# Patient Record
Sex: Female | Born: 1980
Health system: Southern US, Community
[De-identification: ages and names within clinical notes are randomized; demographics above are authoritative.]

## PROBLEM LIST (undated history)

## (undated) DIAGNOSIS — O24419 Gestational diabetes mellitus in pregnancy, unspecified control: Secondary | ICD-10-CM

## (undated) DIAGNOSIS — Z789 Other specified health status: Secondary | ICD-10-CM

## (undated) DIAGNOSIS — B181 Chronic viral hepatitis B without delta-agent: Principal | ICD-10-CM

## (undated) DIAGNOSIS — I10 Essential (primary) hypertension: Secondary | ICD-10-CM

## (undated) DIAGNOSIS — T4145XA Adverse effect of unspecified anesthetic, initial encounter: Secondary | ICD-10-CM

## (undated) DIAGNOSIS — T8859XA Other complications of anesthesia, initial encounter: Secondary | ICD-10-CM

## (undated) HISTORY — DX: Chronic viral hepatitis B without delta-agent: B18.1

## (undated) HISTORY — PX: WISDOM TOOTH EXTRACTION: SHX21

## (undated) HISTORY — DX: Essential (primary) hypertension: I10

## (undated) HISTORY — PX: NO PAST SURGERIES: SHX2092

---

## 2002-02-27 ENCOUNTER — Other Ambulatory Visit: Admission: RE | Admit: 2002-02-27 | Discharge: 2002-02-27 | Payer: Self-pay | Admitting: Obstetrics & Gynecology

## 2003-03-29 ENCOUNTER — Other Ambulatory Visit: Admission: RE | Admit: 2003-03-29 | Discharge: 2003-03-29 | Payer: Self-pay | Admitting: Obstetrics & Gynecology

## 2010-10-20 LAB — CBC: HCT: 37 % (ref 36–46)

## 2010-10-20 LAB — GC/CHLAMYDIA PROBE AMP, GENITAL
Chlamydia: NEGATIVE
Gonorrhea: NEGATIVE

## 2010-10-20 LAB — HIV ANTIBODY (ROUTINE TESTING W REFLEX): HIV: NONREACTIVE

## 2010-10-21 ENCOUNTER — Other Ambulatory Visit (HOSPITAL_COMMUNITY): Payer: Self-pay | Admitting: Obstetrics and Gynecology

## 2010-10-21 DIAGNOSIS — O3680X0 Pregnancy with inconclusive fetal viability, not applicable or unspecified: Secondary | ICD-10-CM

## 2010-10-23 ENCOUNTER — Ambulatory Visit (HOSPITAL_COMMUNITY)
Admission: RE | Admit: 2010-10-23 | Discharge: 2010-10-23 | Disposition: A | Discharge status: Home / Self Care | Payer: BC Managed Care – PPO | Source: Ambulatory Visit | Attending: Obstetrics and Gynecology | Admitting: Obstetrics and Gynecology

## 2010-10-23 ENCOUNTER — Encounter (HOSPITAL_COMMUNITY): Payer: Self-pay

## 2010-10-23 ENCOUNTER — Other Ambulatory Visit (HOSPITAL_COMMUNITY): Payer: Self-pay | Admitting: Obstetrics and Gynecology

## 2010-10-23 DIAGNOSIS — O3510X0 Maternal care for (suspected) chromosomal abnormality in fetus, unspecified, not applicable or unspecified: Secondary | ICD-10-CM | POA: Insufficient documentation

## 2010-10-23 DIAGNOSIS — O351XX0 Maternal care for (suspected) chromosomal abnormality in fetus, not applicable or unspecified: Secondary | ICD-10-CM | POA: Insufficient documentation

## 2010-10-23 DIAGNOSIS — Z3682 Encounter for antenatal screening for nuchal translucency: Secondary | ICD-10-CM

## 2010-10-23 DIAGNOSIS — O3680X0 Pregnancy with inconclusive fetal viability, not applicable or unspecified: Secondary | ICD-10-CM

## 2010-10-23 DIAGNOSIS — Z3689 Encounter for other specified antenatal screening: Secondary | ICD-10-CM | POA: Insufficient documentation

## 2010-10-23 NOTE — ED Notes (Signed)
Nuchal translucency ultrasound done and first blood draw for integrated screening.  Pt will return in 3 weeks for second blood draw.

## 2010-11-13 ENCOUNTER — Ambulatory Visit (HOSPITAL_COMMUNITY)
Admission: RE | Admit: 2010-11-13 | Discharge: 2010-11-13 | Disposition: A | Discharge status: Home / Self Care | Payer: BC Managed Care – PPO | Source: Ambulatory Visit | Attending: Obstetrics and Gynecology | Admitting: Obstetrics and Gynecology

## 2010-11-13 ENCOUNTER — Other Ambulatory Visit: Payer: Self-pay | Admitting: Maternal and Fetal Medicine

## 2010-11-13 DIAGNOSIS — O351XX0 Maternal care for (suspected) chromosomal abnormality in fetus, not applicable or unspecified: Secondary | ICD-10-CM | POA: Insufficient documentation

## 2010-11-13 DIAGNOSIS — O3510X0 Maternal care for (suspected) chromosomal abnormality in fetus, unspecified, not applicable or unspecified: Secondary | ICD-10-CM | POA: Insufficient documentation

## 2011-04-03 LAB — STREP B DNA PROBE: GBS: NEGATIVE

## 2011-04-07 NOTE — L&D Delivery Note (Signed)
Delivery Note At 7:49 AM a viable female. "Janet Jacobs", was delivered via Vaginal, Spontaneous Delivery (Presentation: Right Occiput Anterior).  APGAR: 8, 9; weight 7 lb 13 oz (3544 g).   Placenta status: Intact, Spontaneous.  Cord: 3 vessels with the following complications: None.  Cord pH: NA.  Labored in the tub, then moved to the bed when patient had difficulty coordinating pushing efforts.  Anesthesia: Local  Episiotomy: None Lacerations: 2nd degree;Perineal Suture Repair: 2.0 3.0 vicryl rapide Est. Blood Loss (mL):   Mom to postpartum.  Baby to skin to skin with mom.  Nigel Bridgeman 04/30/2011, 8:59 AM

## 2011-04-30 ENCOUNTER — Inpatient Hospital Stay (HOSPITAL_COMMUNITY)
Admission: AD | Admit: 2011-04-30 | Discharge: 2011-05-02 | DRG: 775 | Disposition: A | Discharge status: Home / Self Care | Payer: Medicaid Other | Attending: Obstetrics and Gynecology | Admitting: Obstetrics and Gynecology

## 2011-04-30 ENCOUNTER — Encounter (HOSPITAL_COMMUNITY): Payer: Self-pay | Admitting: *Deleted

## 2011-04-30 DIAGNOSIS — D649 Anemia, unspecified: Secondary | ICD-10-CM | POA: Diagnosis not present

## 2011-04-30 DIAGNOSIS — O9903 Anemia complicating the puerperium: Secondary | ICD-10-CM | POA: Diagnosis not present

## 2011-04-30 DIAGNOSIS — E669 Obesity, unspecified: Secondary | ICD-10-CM | POA: Diagnosis present

## 2011-04-30 HISTORY — DX: Other specified health status: Z78.9

## 2011-04-30 LAB — COMPREHENSIVE METABOLIC PANEL
Alkaline Phosphatase: 191 U/L — ABNORMAL HIGH (ref 39–117)
BUN: 7 mg/dL (ref 6–23)
Chloride: 100 mEq/L (ref 96–112)
GFR calc Af Amer: >90 mL/min (ref >90)
Glucose, Bld: 118 mg/dL — ABNORMAL HIGH (ref 70–99)
Potassium: 3.5 mEq/L (ref 3.5–5.1)
Total Bilirubin: 0.3 mg/dL (ref 0.3–1.2)
Total Protein: 5.7 g/dL — ABNORMAL LOW (ref 6.0–8.3)

## 2011-04-30 LAB — LACTATE DEHYDROGENASE: LDH: 188 U/L (ref 94–250)

## 2011-04-30 LAB — CBC
HCT: 34.9 % — ABNORMAL LOW (ref 36.0–46.0)
MCV: 85.3 fL (ref 78.0–100.0)
RDW: 14.1 % (ref 11.5–15.5)
WBC: 17.3 10*3/uL — ABNORMAL HIGH (ref 4.0–10.5)

## 2011-04-30 LAB — ANTIBODY SCREEN: Antibody Screen: NEGATIVE

## 2011-04-30 LAB — GC/CHLAMYDIA PROBE AMP, GENITAL: Gonorrhea: NEGATIVE

## 2011-04-30 LAB — HEPATITIS B SURFACE ANTIGEN: Hepatitis B Surface Ag: NEGATIVE

## 2011-04-30 LAB — RUBELLA ANTIBODY, IGM: Rubella: IMMUNE

## 2011-04-30 MED ORDER — MAGNESIUM HYDROXIDE 400 MG/5ML PO SUSP: 30.0000 mL | ORAL | Status: DC | PRN

## 2011-04-30 MED ORDER — OXYCODONE-ACETAMINOPHEN 5-325 MG PO TABS
2.0000 | ORAL_TABLET | ORAL | Status: DC | PRN
Start: 2011-04-30 — End: 2011-04-30

## 2011-04-30 MED ORDER — IBUPROFEN 600 MG PO TABS
600.0000 mg | ORAL_TABLET | Freq: Four times a day (QID) | ORAL | Status: DC
  Administered 2011-04-30 – 2011-05-02 (×8): 600 mg via ORAL
  Filled 2011-04-30 (×8): qty 1

## 2011-04-30 MED ORDER — BUTORPHANOL TARTRATE 2 MG/ML IJ SOLN: 1.0000 mg | INTRAMUSCULAR | Status: DC | PRN

## 2011-04-30 MED ORDER — LACTATED RINGERS IV SOLN: 500.0000 mL | INTRAVENOUS | Status: DC | PRN

## 2011-04-30 MED ORDER — OXYTOCIN 10 UNIT/ML IJ SOLN
INTRAMUSCULAR | Status: AC
  Filled 2011-04-30: qty 2

## 2011-04-30 MED ORDER — TETANUS-DIPHTH-ACELL PERTUSSIS 5-2.5-18.5 LF-MCG/0.5 IM SUSP: 0.5000 mL | Freq: Once | INTRAMUSCULAR | Status: DC

## 2011-04-30 MED ORDER — OXYTOCIN BOLUS FROM INFUSION
500.0000 mL | Freq: Once | INTRAVENOUS | Status: DC
  Filled 2011-04-30: qty 500

## 2011-04-30 MED ORDER — ZOLPIDEM TARTRATE 5 MG PO TABS: 5.0000 mg | ORAL_TABLET | Freq: Every evening | ORAL | Status: DC | PRN

## 2011-04-30 MED ORDER — HYDROXYZINE HCL 50 MG/ML IM SOLN: 50.0000 mg | Freq: Four times a day (QID) | INTRAMUSCULAR | Status: DC | PRN

## 2011-04-30 MED ORDER — DIBUCAINE 1 % RE OINT: 1.0000 "application " | TOPICAL_OINTMENT | RECTAL | Status: DC | PRN

## 2011-04-30 MED ORDER — MEASLES, MUMPS & RUBELLA VAC ~~LOC~~ INJ
0.5000 mL | INJECTION | Freq: Once | SUBCUTANEOUS | Status: DC
  Filled 2011-04-30: qty 0.5

## 2011-04-30 MED ORDER — LANOLIN HYDROUS EX OINT: TOPICAL_OINTMENT | CUTANEOUS | Status: DC | PRN

## 2011-04-30 MED ORDER — CITRIC ACID-SODIUM CITRATE 334-500 MG/5ML PO SOLN: 30.0000 mL | ORAL | Status: DC | PRN

## 2011-04-30 MED ORDER — FLEET ENEMA 7-19 GM/118ML RE ENEM: 1.0000 | ENEMA | RECTAL | Status: DC | PRN

## 2011-04-30 MED ORDER — CALCIUM CARBONATE ANTACID 500 MG PO CHEW
400.0000 mg | CHEWABLE_TABLET | Freq: Three times a day (TID) | ORAL | Status: DC | PRN
  Administered 2011-04-30: 400 mg via ORAL
  Filled 2011-04-30: qty 2

## 2011-04-30 MED ORDER — SIMETHICONE 80 MG PO CHEW
80.0000 mg | CHEWABLE_TABLET | ORAL | Status: DC | PRN
  Administered 2011-04-30: 80 mg via ORAL

## 2011-04-30 MED ORDER — OXYTOCIN 20 UNITS IN LACTATED RINGERS INFUSION - SIMPLE: 125.0000 mL/h | Freq: Once | INTRAVENOUS | Status: DC

## 2011-04-30 MED ORDER — SENNOSIDES-DOCUSATE SODIUM 8.6-50 MG PO TABS
2.0000 | ORAL_TABLET | Freq: Every day | ORAL | Status: DC
  Administered 2011-04-30 – 2011-05-01 (×2): 2 via ORAL

## 2011-04-30 MED ORDER — HYDROXYZINE HCL 50 MG PO TABS: 50.0000 mg | ORAL_TABLET | Freq: Four times a day (QID) | ORAL | Status: DC | PRN

## 2011-04-30 MED ORDER — WITCH HAZEL-GLYCERIN EX PADS
1.0000 "application " | MEDICATED_PAD | CUTANEOUS | Status: DC | PRN
  Administered 2011-04-30: 1 via TOPICAL

## 2011-04-30 MED ORDER — DIPHENHYDRAMINE HCL 25 MG PO CAPS: 25.0000 mg | ORAL_CAPSULE | Freq: Four times a day (QID) | ORAL | Status: DC | PRN

## 2011-04-30 MED ORDER — LIDOCAINE HCL (PF) 1 % IJ SOLN
30.0000 mL | INTRAMUSCULAR | Status: DC | PRN
  Administered 2011-04-30: 30 mL via SUBCUTANEOUS
  Filled 2011-04-30: qty 30

## 2011-04-30 MED ORDER — ONDANSETRON HCL 4 MG PO TABS: 4.0000 mg | ORAL_TABLET | ORAL | Status: DC | PRN

## 2011-04-30 MED ORDER — ONDANSETRON HCL 4 MG/2ML IJ SOLN: 4.0000 mg | Freq: Four times a day (QID) | INTRAMUSCULAR | Status: DC | PRN

## 2011-04-30 MED ORDER — BENZOCAINE-MENTHOL 20-0.5 % EX AERO
INHALATION_SPRAY | CUTANEOUS | Status: AC
  Administered 2011-04-30: 1 via TOPICAL
  Filled 2011-04-30: qty 56

## 2011-04-30 MED ORDER — PRENATAL MULTIVITAMIN CH
1.0000 | ORAL_TABLET | Freq: Every day | ORAL | Status: DC
  Administered 2011-04-30 – 2011-05-02 (×3): 1 via ORAL
  Filled 2011-04-30 (×3): qty 1

## 2011-04-30 MED ORDER — BENZOCAINE-MENTHOL 20-0.5 % EX AERO
1.0000 "application " | INHALATION_SPRAY | CUTANEOUS | Status: DC | PRN
  Administered 2011-04-30 – 2011-05-02 (×2): 1 via TOPICAL

## 2011-04-30 MED ORDER — ACETAMINOPHEN 325 MG PO TABS: 650.0000 mg | ORAL_TABLET | ORAL | Status: DC | PRN

## 2011-04-30 MED ORDER — ONDANSETRON HCL 4 MG/2ML IJ SOLN: 4.0000 mg | INTRAMUSCULAR | Status: DC | PRN

## 2011-04-30 MED ORDER — IBUPROFEN 600 MG PO TABS
600.0000 mg | ORAL_TABLET | Freq: Four times a day (QID) | ORAL | Status: DC | PRN
  Administered 2011-04-30: 600 mg via ORAL
  Filled 2011-04-30: qty 1

## 2011-04-30 MED ORDER — OXYCODONE-ACETAMINOPHEN 5-325 MG PO TABS: 1.0000 | ORAL_TABLET | ORAL | Status: DC | PRN

## 2011-04-30 NOTE — Progress Notes (Signed)
Patient in tub @ [phone]

## 2011-04-30 NOTE — Progress Notes (Addendum)
S:  Pt feeling more urge to push; also fatigued.  Moaning w/ ctxs and breathing.  C/o significant heartburn.  GFM.  No LOF. Temp of tub water =98.8 O:  .Marland Kitchen Filed Vitals:   04/30/11 0335 04/30/11 0356 04/30/11 0359 04/30/11 0400  BP: 126/74  136/115 136/79  Pulse: 102   105  Temp:  97.6 F (36.4 C)  97.6 F (36.4 C)  TempSrc:  Oral  Oral  Resp: 20 20    Height:  5\' 4"  (1.626 m)    Weight:  122.018 kg (269 lb)    FHR on NST about 1 hr ago 150's; intermittent monitoring since has been in 150's UC's 2-4 min SVE:  7-8/80/-1, intact  A:  IUP at 40.3  Active labor w/ good progress since admission.  GBS neg  FHR WNL P:  Offered AROM, declines at present.  Return to tub now.

## 2011-04-30 NOTE — Progress Notes (Signed)
Pt G3 P0 at 40.3wks having contractions and bloody show.

## 2011-04-30 NOTE — Progress Notes (Signed)
0600 water temp 99

## 2011-04-30 NOTE — Progress Notes (Signed)
0500 Tub temp 99

## 2011-04-30 NOTE — ED Notes (Signed)
Pt to room 161 via wheelchair.

## 2011-04-30 NOTE — H&P (Signed)
Janet Jacobs is a 31 y.o. obese black female presenting at 40.3 per EDC of 04/27/11 in active labor.  Onset of regular ctxs around 2300.  No LOF, but has noted small amt of bloody show.  Desires waterbirth.  Denies UTI or PIH s/s.  Reports GFM.  Follow[facility] by [facility].  Onset of care at [facility] around 12 weeks.  Hx of LL placenta, but resolved.  Pregnancy has been overall uncomplicated.   G1= EAB 1998 G2=EAB 2011 G3=current pregnancy Maternal Medical History:  Reason for admission: Reason for admission: contractions.  Contractions: Onset was 3-5 hours ago.    Fetal activity: Perceived fetal activity is normal.   Last perceived fetal movement was within the past hour.    Prenatal complications: 1.  H/o EAB x2 2.  Smoking hx 3.  Mild Latex allergy 4.  Obese 5.  H/o Low-lying placenta     OB History    Grav Para Term Preterm Abortions TAB SAB Ect Mult Living   3 0 0 0 2 2 0 0 0 0      Past Medical History  Diagnosis Date  . No pertinent past medical history    Past Surgical History  Procedure Date  . Wisdom tooth extraction    Family History: parents-HTN; father-DM; MGF-prostate CA; MGM--manic/depressive & schizophrenia. Social History:  reports that she has never smoked. She does not have any smokeless tobacco history on file. She reports that she does not drink alcohol or use illicit drugs.  Review of Systems  Constitutional: Negative.   HENT: Negative.   Eyes: Negative.   Respiratory: Negative.   Cardiovascular: Negative.   Gastrointestinal: Positive for diarrhea.       Loose stools tonight  Genitourinary: Negative.   Skin: Negative.   Neurological: Negative.     Dilation: 5 Effacement (%): 80 Station: -2 Exam by:: H. Hydia Copelin CNM. Blood pressure 126/74, pulse 102, temperature 97.6 F (36.4 C), temperature source Oral, resp. rate 20, height 5\' 4"  (1.626 m), weight 122.018 kg (269 lb), last menstrual period 07/21/2010. Maternal Exam:  Uterine Assessment:  Contraction strength is moderate.  Contraction frequency is regular.   Abdomen: Patient reports no abdominal tenderness. Fetal presentation: vertex  Introitus: Normal vulva. Ferning test: not done.  Nitrazine test: not done.  Pelvis: adequate for delivery.   Cervix: Cervix evaluated by digital exam.     Fetal Exam Fetal Monitor Review: Mode: ultrasound.   Baseline rate: 150.  Variability: moderate (6-25 bpm).   Pattern: no decelerations and no accelerations.   10x10 accels; nonreactive  Fetal State Assessment: Category I - tracings are normal.     Physical Exam  Constitutional: She is oriented to person, place, and time. She appears well-developed and well-nourished. She appears distressed.       Breathing, moaning, & grimace w/ ctxs  Cardiovascular: Normal rate.   Respiratory: Effort normal.  GI: Soft. Bowel sounds are normal.       gravid  Genitourinary:       Cx:  5.5/80/-2 BBOW mid to ant position  Musculoskeletal: She exhibits edema.       1+ BLE edema; pitting  Neurological: She is alert and oriented to person, place, and time.  Skin: Skin is warm and dry.  Psychiatric: She has a normal mood and affect. Her behavior is normal. Judgment and thought content normal.    Prenatal labs: ABO, Rh: A/Positive/-- (01/24 0350) Antibody: Negative (01/24 0350) Rubella: Immune (01/24 0350) RPR: Nonreactive (01/24 0350)  HBsAg:  Negative (01/24 0350)  HIV: Non-reactive (01/24 0350)  GBS: Negative (01/24 0000)  Early 1hr gtt elevated, but 3hr gtt WNL 1st trimester screen WNL Assessment/Plan: 1.  IUP at 40.3 2. Active labor 3.  Desires waterbirth 4.  Cat 1 FHT 5.  Mild Latex allergy  1.  Admit to [facility] w/ dr. Pennie Rushing as attending w/ routine L&D orders 2.  Intermittent monitoring 3. Support as needed 4.  AROM prn 5.  Anticipate SVD  Niza Soderholm H 04/30/2011, 4:32 AM

## 2011-05-01 LAB — CBC
Hemoglobin: 9.5 g/dL — ABNORMAL LOW (ref 12.0–15.0)
MCV: 86.1 fL (ref 78.0–100.0)
Platelets: 199 10*3/uL (ref 150–400)
RBC: 3.32 MIL/uL — ABNORMAL LOW (ref 3.87–5.11)
WBC: 21.9 10*3/uL — ABNORMAL HIGH (ref 4.0–10.5)

## 2011-05-01 NOTE — Progress Notes (Signed)
Post Partum Day 1 Subjective: no complaints.  Doing well.  Up ad lib, no syncope or dizziness.  Breastfeeding going well.    Objective: Blood pressure 106/83, pulse 100, temperature 98.1 F (36.7 C), temperature source Oral, resp. rate 18, height 5\' 4"  (1.626 m), weight 122.018 kg (269 lb), last menstrual period 07/21/2010, SpO2 98.00%, unknown if currently breastfeeding.  Physical Exam:  General: alert Lochia: appropriate Uterine Fundus: firm Incision: healing well DVT Evaluation: No evidence of DVT seen on physical exam. Negative Homan's sign.   Basename 05/01/11 0520 04/30/11 0515  HGB 9.5* 11.5*  HCT 28.6* 34.9*   Results for orders placed during the hospital encounter of 04/30/11 (from the past 24 hour(s))  CBC     Status: Abnormal   Collection Time   05/01/11  5:20 AM      Component Value Range   WBC 21.9 (*) 4.0 - 10.5 (K/uL)   RBC 3.32 (*) 3.87 - 5.11 (MIL/uL)   Hemoglobin 9.5 (*) 12.0 - 15.0 (g/dL)   HCT 60.4 (*) 54.0 - 46.0 (%)   MCV 86.1  78.0 - 100.0 (fL)   MCH 28.6  26.0 - 34.0 (pg)   MCHC 33.2  30.0 - 36.0 (g/dL)   RDW 98.1  19.1 - 47.8 (%)   Platelets 199  150 - 400 (K/uL)   WBC ct 17.3 before delivery  Assessment/Plan: Mild leukocytosis--repeat CBC/diff Normal pp status Anticipate d/c tomorrow. Reviewed contraceptive choices--patient currently undecided.    LOS: 1 day   Dominyk Law 05/01/2011, 9:22 AM

## 2011-05-01 NOTE — Progress Notes (Signed)
UR chart review completed.  

## 2011-05-02 LAB — DIFFERENTIAL
Eosinophils Relative: 3 % (ref 0–5)
Lymphocytes Relative: 25 % (ref 12–46)
Monocytes Absolute: 0.9 10*3/uL (ref 0.1–1.0)
Monocytes Relative: 6 % (ref 3–12)
Neutro Abs: 10.4 10*3/uL — ABNORMAL HIGH (ref 1.7–7.7)

## 2011-05-02 LAB — CBC
HCT: 28.2 % — ABNORMAL LOW (ref 36.0–46.0)
Hemoglobin: 9.1 g/dL — ABNORMAL LOW (ref 12.0–15.0)
MCV: 87 fL (ref 78.0–100.0)
WBC: 15.7 10*3/uL — ABNORMAL HIGH (ref 4.0–10.5)

## 2011-05-02 MED ORDER — IBUPROFEN 600 MG PO TABS: 600.0000 mg | ORAL_TABLET | Freq: Four times a day (QID) | ORAL | Status: AC

## 2011-05-02 MED ORDER — BENZOCAINE-MENTHOL 20-0.5 % EX AERO
INHALATION_SPRAY | CUTANEOUS | Status: AC
  Administered 2011-05-02: 1 via TOPICAL
  Filled 2011-05-02: qty 56

## 2011-05-02 MED ORDER — OXYCODONE-ACETAMINOPHEN 5-325 MG PO TABS: 1.0000 | ORAL_TABLET | Freq: Four times a day (QID) | ORAL | Status: AC | PRN

## 2011-05-02 MED ORDER — FERROUS SULFATE 325 (65 FE) MG PO TABS: 325.0000 mg | ORAL_TABLET | Freq: Every day | ORAL | Status: DC

## 2011-05-02 NOTE — Discharge Summary (Signed)
Obstetric Discharge Summary Reason for Admission: onset of labor Prenatal Procedures: ultrasound Intrapartum Procedures: spontaneous vaginal delivery Postpartum Procedures: none Complications-Operative and Postpartum: 2nd degree perineal laceration Hemoglobin  Date Value Range Status  05/02/2011 9.1* 12.0-15.0 (g/dL) Final     HCT  Date Value Range Status  05/02/2011 28.2* 36.0-46.0 (%) Final    Discharge Diagnoses: Term Pregnancy-delivered  Discharge Information: Date: 05/02/2011 Activity: unrestricted Diet: routine Medications: PNV, ibuprofen, Percocet, Ferrous Sulfate Condition: stable Instructions: refer to practice specific booklet Discharge to: home Follow-up Information    Follow up with [facility] in 6 weeks.       Plans condoms for contraception.    Newborn Data: Live born female  Birth Weight: 7 lb 13 oz (3544 g) APGAR: 8, 9  Home with mother.  Mimi Debellis O. 05/02/2011, 10:38 AM

## 2011-05-02 NOTE — Progress Notes (Signed)
Post Partum Day 2 Subjective: Reports feeling well.  Ambulating, voiding and tol po liquids and solids without difficulty.  Passing flatus but no BM yest.  Denies weakness or dizzines.  Working on breastfeeding.    Objective: Blood pressure 126/80, pulse 91, temperature 97.9 F (36.6 C), temperature source Oral, resp. rate 20, height 5\' 4"  (1.626 m), weight 122.018 kg (269 lb), last menstrual period 07/21/2010, SpO2 97.00%, unknown if currently breastfeeding. Filed Vitals:   05/01/11 0540 05/01/11 1403 05/01/11 2214 05/02/11 0521  BP: 106/83 114/75 116/78 126/80  Pulse: 100 101 97 91  Temp: 98.1 F (36.7 C) 98.1 F (36.7 C) 97.5 F (36.4 C) 97.9 F (36.6 C)  TempSrc: Oral Oral Oral Oral  Resp: 18 18 18 20   Height:      Weight:      SpO2:   97%    Physical Exam:  General: alert, cooperative and no distress Heart:  RRR Lungs:  CTA bilat Abd:  Soft, non-tender with pos BS x 4 quads. Lochia: appropriate, sm rubra Uterine Fundus: firm, non-tender 1 below umbilicus Incision: healing well, perineum intact. DVT Evaluation: No evidence of DVT seen on physical exam. Negative Homan's sign. No significant calf/ankle edema.   Basename 05/02/11 0550 05/01/11 0520  HGB 9.1* 9.5*  HCT 28.2* 28.6*    Assessment/Plan: Discharge home Stable s/p vag delivery Asymptomatic PP anemia  Discharge to home. Discharge instructions reviewed. RTO 6 wks for follow-up Rx:  Motrin, Percoct, PNV, Ferrous Sulfate Plans condoms for contraception.     LOS: 2 days   Janet Jacobs O. 05/02/2011, 10:41 AM

## 2011-06-04 ENCOUNTER — Ambulatory Visit (INDEPENDENT_AMBULATORY_CARE_PROVIDER_SITE_OTHER): Payer: Medicaid Other | Admitting: Obstetrics and Gynecology

## 2011-06-04 DIAGNOSIS — D508 Other iron deficiency anemias: Secondary | ICD-10-CM

## 2012-10-27 LAB — OB RESULTS CONSOLE GC/CHLAMYDIA
Chlamydia: NEGATIVE
Gonorrhea: NEGATIVE

## 2012-10-27 LAB — OB RESULTS CONSOLE HIV ANTIBODY (ROUTINE TESTING): HIV: NONREACTIVE

## 2012-10-27 LAB — OB RESULTS CONSOLE RPR: RPR: NONREACTIVE

## 2012-11-14 ENCOUNTER — Other Ambulatory Visit: Payer: Self-pay

## 2012-11-16 ENCOUNTER — Other Ambulatory Visit: Payer: Self-pay | Admitting: Obstetrics and Gynecology

## 2012-11-16 DIAGNOSIS — O283 Abnormal ultrasonic finding on antenatal screening of mother: Secondary | ICD-10-CM

## 2012-11-18 ENCOUNTER — Other Ambulatory Visit: Payer: Self-pay | Admitting: Obstetrics and Gynecology

## 2012-11-18 ENCOUNTER — Ambulatory Visit (HOSPITAL_COMMUNITY)
Admission: RE | Admit: 2012-11-18 | Discharge: 2012-11-18 | Disposition: A | Discharge status: Home / Self Care | Payer: BC Managed Care – PPO | Source: Ambulatory Visit | Attending: Obstetrics and Gynecology | Admitting: Obstetrics and Gynecology

## 2012-11-18 ENCOUNTER — Encounter (HOSPITAL_COMMUNITY): Payer: Self-pay

## 2012-11-18 ENCOUNTER — Other Ambulatory Visit: Payer: Self-pay

## 2012-11-18 DIAGNOSIS — O283 Abnormal ultrasonic finding on antenatal screening of mother: Secondary | ICD-10-CM

## 2012-11-18 DIAGNOSIS — O358XX0 Maternal care for other (suspected) fetal abnormality and damage, not applicable or unspecified: Secondary | ICD-10-CM | POA: Insufficient documentation

## 2012-11-18 NOTE — Progress Notes (Signed)
Genetic Counseling  High-Risk Gestation Note  Appointment Date:  11/18/2012 Referred By: Janet Arthur, MD Date of Birth:  May 10, 1980   Pregnancy History: [id number] Estimated Date of Delivery: 05/27/13 Estimated Gestational Age: [redacted]w[redacted]d Attending: Particia Nearing, MD   [name] was seen for genetic counseling because of the previous ultrasound measurement of increased nuchal translucency and an increased risk for fetal Down syndrome and trisomy 18/13 based on first trimester screening through [facility].  Ultrasound was performed at the time of today's ultrasound and confirmed the finding of increased nuchal translucency. Complete ultrasound result reported separately.   We discussed that the fetal NT refers to a fluid filled space between the skin and soft tissues behind the cervical spine.  This space is traditionally measurable between 11 and 13.[redacted] weeks gestation and is considered enlarged at ~3 mm or greater. [name]  was counseled regarding the various common etiologies for an enlarged NT including: normal variation, aneuploidy, single gene conditions, cardiac or great vessel abnormalities, lymphatic system failure, decreased fetal movement, and fetal anemia.    We discussed the results of [name]'s first trimester screening and the associated increased risks for fetal Down syndrome (1 in 413 to 1 in 278) and trisomy 18/13 (1 in 718 to 1 in 14). We reviewed chromosomes, nondisjunction, and the common features and prognoses of Down syndrome, trisomy 90, and trisomy 84. In addition, we discussed that the increased NT measurement is associated with an approximate 1% risk for a fetal cardiac anomaly.  We also discussed single gene conditions and that these conditions are not routinely tested for prenatally unless ultrasound findings or family history significantly increase the suspicion of a specific single gene disorder.  We briefly reviewed common inheritance patterns (dominant,  recessive, and X-linked) as well as the associated risks of recurrence.     She was then counseled regarding the screening options of noninvasive prenatal screening (NIPS)/cell free fetal DNA testing, 2nd trimester detailed ultrasound, and fetal echocardiogram.  We reviewed the benefits, limitations, and risks of these options including the conditions for which each screens, the detection rates, and the false positive rates for each. She was also counseled regarding the diagnostic testing options of chorionic villus sampling (CVS) and amniocentesis. We discussed the risks, benefits, and limitations of these tests. We reviewed the associated 1 in 100 risk for complications with CVS and the associated 1 in 300-500 risk for complications with amniocentesis, including spontaneous pregnancy loss for each. She was counseled that the fetal prognosis and recurrence risk for future pregnancies depends on the underlying etiology of the enlarged NT and further anticipatory guidance can be provided if a diagnosis is discovered.  She understands that the legal limit for TAB in [place] is [redacted] weeks gestation. After thoughtful consideration, [name] elected to proceed with NIPS (Panorama) at the time of today's visit. These results will be available in approximately 8-10 days.  [name] indicated that she would possibly consider amniocentesis pending the results of NIPS.  Additionally, detailed ultrasound at ~18 weeks and fetal echocardiogram are available to the patient. Please contact our office if you would like [place] to facilitate either of these studies.    [name] was provided with written information regarding cystic fibrosis (CF) including the carrier frequency and incidence in the Caucasian and African [place] populations, the availability of carrier testing and prenatal diagnosis if indicated.  In addition, we discussed that CF is routinely screened for as part of the [facility] newborn screening  panel.  She declined testing  today.   Additionally, the patient reported Jewish ancestry for her maternal family history. The father of the pregnancy has no known Ashkenazi Jewish ancestry, and consanguinity was denied for the couple. We discussed the availability of carrier screening for certain genetic conditions based on Jewish ancestry.  Genetic testing is available for some of the more common conditions, including Canavan disease, cystic fibrosis, Tay sachs disease, Gaucher disease, Mucolipidosis type IV,  Neimann-Pick type A, Fanconi anemia type C, Bloom syndrome, and familial dysautonomia.  All of these conditions are inherited in an autosomal recessive fashion, where both parents have to be carriers of the condition before a baby is at increased risk (1 in 4, or 25% risk) to inherit the disease. We discussed that the risk for the father of the pregnancy to be a carrier for these conditions is significantly decreased given the report of no known Jewish ancestry for him. Carrier screening performed through a standard blood draw can reduce but not eliminate a person's chance to be a carrier for these conditions.  [name] declined Ashkenazi Jewish ancestry based carrier screening at the time of today's visit.   Both family histories were reviewed and found to be contributory for a congenital heart defect for the father of the pregnancy's father. He reportedly required surgical correction. He is otherwise healthy. Congenital heart defects occur in approximately 1% of pregnancies. Congenital heart defects may occur due to multifactorial influences, chromosomal abnormalities, genetic syndromes or environmental exposures.  Isolated heart defects are generally multifactorial.  Given the reported family history and assuming multifactorial inheritance, the risk for a congenital heart defect in the current pregnancy would be approximately 1-2%. We reviewed the options of targeted ultrasound and fetal echocardiogram to assess the fetal  heart in detail. The patient understands that ultrasound cannot diagnose or rule out all birth defects prenatally. Without further information regarding the provided family history, an accurate genetic risk cannot be calculated. Further genetic counseling is warranted if more information is obtained.  [name] denied exposure to environmental toxins or chemical agents. She denied the use of alcohol, tobacco or street drugs. She denied significant viral illnesses during the course of her pregnancy. Her medical and surgical histories were noncontributory.   I counseled [name] for approximately 45 minutes regarding the above risks and available options.   [name], MS,  Certified Genetic Counselor 11/18/2012

## 2012-11-24 ENCOUNTER — Telehealth (HOSPITAL_COMMUNITY): Payer: Self-pay | Admitting: MS"

## 2012-11-24 NOTE — Telephone Encounter (Addendum)
Left message for patient to return call.   Janet Jacobs 11/24/2012 4:23 PM

## 2012-11-24 NOTE — Telephone Encounter (Signed)
Patient returned call. Was calling patient regarding her noninvasive prenatal screening, Panorama, performed through [place]. Discussed with patient that no result was able to be obtained due to low fetal fraction in the sample. Reviewed with the patient that a redraw is requested, if patient would still like to pursue with this screening option. Discussed that typically a fetal fraction of 4% or higher is required for interpretation, and the patient's sample had 3.3% fetal fraction. We discussed that submitting a sample (buccal swab) from the father of the pregnancy can assist with obtaining a result in cases of lower fetal fraction. We also reviewed that fetal fraction tends to increase with increasing gestation. [name] planned to return on Monday, 8/25 with the father of the pregnancy for submission of second sample for NIPS.   Janet Jacobs 11/24/2012 4:48 PM

## 2012-11-28 ENCOUNTER — Ambulatory Visit (HOSPITAL_COMMUNITY)
Admission: RE | Admit: 2012-11-28 | Discharge: 2012-11-28 | Disposition: A | Discharge status: Home / Self Care | Payer: BC Managed Care – PPO | Source: Ambulatory Visit | Attending: Obstetrics and Gynecology | Admitting: Obstetrics and Gynecology

## 2012-11-28 ENCOUNTER — Other Ambulatory Visit: Payer: Self-pay | Admitting: Obstetrics

## 2012-11-28 DIAGNOSIS — R9389 Abnormal findings on diagnostic imaging of other specified body structures: Secondary | ICD-10-CM

## 2012-11-28 DIAGNOSIS — O358XX0 Maternal care for other (suspected) fetal abnormality and damage, not applicable or unspecified: Secondary | ICD-10-CM | POA: Insufficient documentation

## 2012-11-29 ENCOUNTER — Other Ambulatory Visit: Payer: Self-pay

## 2012-12-08 ENCOUNTER — Telehealth (HOSPITAL_COMMUNITY): Payer: Self-pay | Admitting: MS"

## 2012-12-08 NOTE — Telephone Encounter (Signed)
Patient called to inquire about cell free fetal DNA testing. Sample was redrawn on 12/01/12. Reviewed with [name] that this sample also yielded no result given low fraction of fetal DNA in the sample. Discussed that in general, the fetal fraction has been observed to increase with increased gestation, so it is possible that a redraw at a later gestation may yield a result. However, we reviewed that there are cases where the fetal fraction remains low throughout pregnancy. We also reviewed that option of amniocentesis. Additionally, we discussed that benefits and limitations of detailed ultrasound. Patient is scheduled to return on 12/16/12 for detailed ultrasound. [name] stated that she would plan to wait for detailed ultrasound next Friday and then further consider redraw for NIPS or possibly amniocentesis pending the ultrasound results that day. Discussed that I would also be available to talk with [name] in person on 9/12 to review these different screening and testing options. She is aware that the legal limit for TOP in [place] is [redacted] weeks gestation. The patient was encouraged to call if additional questions or concerns arise prior to her next visit.   Janet Jacobs 12/08/2012 1:28 PM

## 2012-12-14 ENCOUNTER — Other Ambulatory Visit: Payer: Self-pay | Admitting: Obstetrics and Gynecology

## 2012-12-14 DIAGNOSIS — O283 Abnormal ultrasonic finding on antenatal screening of mother: Secondary | ICD-10-CM

## 2012-12-16 ENCOUNTER — Other Ambulatory Visit: Payer: Self-pay | Admitting: Obstetrics and Gynecology

## 2012-12-16 ENCOUNTER — Encounter: Payer: Self-pay | Admitting: Maternal and Fetal Medicine

## 2012-12-16 ENCOUNTER — Other Ambulatory Visit: Payer: Self-pay

## 2012-12-16 ENCOUNTER — Ambulatory Visit (HOSPITAL_COMMUNITY)
Admission: RE | Admit: 2012-12-16 | Discharge: 2012-12-16 | Disposition: A | Discharge status: Home / Self Care | Payer: BC Managed Care – PPO | Source: Ambulatory Visit | Attending: Obstetrics and Gynecology | Admitting: Obstetrics and Gynecology

## 2012-12-16 ENCOUNTER — Encounter (HOSPITAL_COMMUNITY): Payer: Self-pay

## 2012-12-16 DIAGNOSIS — O358XX0 Maternal care for other (suspected) fetal abnormality and damage, not applicable or unspecified: Secondary | ICD-10-CM | POA: Insufficient documentation

## 2012-12-16 DIAGNOSIS — O283 Abnormal ultrasonic finding on antenatal screening of mother: Secondary | ICD-10-CM

## 2012-12-19 ENCOUNTER — Telehealth (HOSPITAL_COMMUNITY): Payer: Self-pay | Admitting: MS"

## 2012-12-19 NOTE — Telephone Encounter (Signed)
Called Janet Jacobs to discuss the preliminary FISH results from her amniocentesis We reviewed that these are within normal limits.  We again discussed the limitations of FISH and that final results are still pending and will be available in 1-2 weeks.  Patient reported that she has not noticed any concerning symptoms or complications following the procedure.  All questions were answered to her satisfaction, she was encouraged to call with additional questions or concerns.  [name], MS Certified Genetic Counselor 12/19/2012 2:59 PM

## 2012-12-23 ENCOUNTER — Other Ambulatory Visit: Payer: Self-pay | Admitting: Obstetrics and Gynecology

## 2012-12-23 DIAGNOSIS — O358XX0 Maternal care for other (suspected) fetal abnormality and damage, not applicable or unspecified: Secondary | ICD-10-CM

## 2012-12-27 ENCOUNTER — Telehealth (HOSPITAL_COMMUNITY): Payer: Self-pay | Admitting: MS"

## 2012-12-27 NOTE — Telephone Encounter (Signed)
Called Janet Jacobs to discuss the final karyotype results from her amniocentesis. We reviewed that these are within normal limits (46, XY).  We reviewed that amniocentesis does not test for all genetic conditions and did not assess for single gene conditions. We reviewed that fetal echocardiogram would be available given the previous ultrasound finding of increased nuchal translucency. All questions were answered to her satisfaction, she was encouraged to call with additional questions or concerns.  [name], MS Certified Genetic Counselor 12/27/2012 5:19 PM

## 2012-12-30 ENCOUNTER — Ambulatory Visit (HOSPITAL_COMMUNITY)
Admission: RE | Admit: 2012-12-30 | Discharge: 2012-12-30 | Disposition: A | Discharge status: Home / Self Care | Payer: BC Managed Care – PPO | Source: Ambulatory Visit | Attending: Obstetrics and Gynecology | Admitting: Obstetrics and Gynecology

## 2012-12-30 DIAGNOSIS — O358XX0 Maternal care for other (suspected) fetal abnormality and damage, not applicable or unspecified: Secondary | ICD-10-CM | POA: Insufficient documentation

## 2013-01-04 ENCOUNTER — Other Ambulatory Visit: Payer: Self-pay

## 2013-01-04 DIAGNOSIS — D181 Lymphangioma, any site: Secondary | ICD-10-CM | POA: Insufficient documentation

## 2013-04-06 NOTE — L&D Delivery Note (Signed)
Delivery Note   At 10:43 AM a viable female named Janet Jacobs was delivered via Vaginal, Spontaneous Delivery in the water tub  (Presentation: ; Occiput Anterior).  APGAR: 8, 9; weight 7 lb 15 oz (3600 g).   Placenta status: Intact, Spontaneous.  Cord: 3 vessels with the following complications: None  Anesthesia: 1% lidocaine for local anesthesia Episiotomy: None Lacerations: 2nd degree Suture Repair: 3.0 Monocryl Est. Blood Loss (mL): 250  Mom to postpartum.  Baby to Couplet care / Skin to Skin.  Cecilee Rosner A 05/30/2013, 12:32 PM

## 2013-05-30 ENCOUNTER — Encounter (HOSPITAL_COMMUNITY): Payer: Self-pay

## 2013-05-30 ENCOUNTER — Inpatient Hospital Stay (HOSPITAL_COMMUNITY)
Admission: AD | Admit: 2013-05-30 | Discharge: 2013-05-31 | DRG: 775 | Disposition: A | Discharge status: Home / Self Care | Payer: BC Managed Care – PPO | Source: Ambulatory Visit | Attending: Obstetrics and Gynecology | Admitting: Obstetrics and Gynecology

## 2013-05-30 DIAGNOSIS — O9903 Anemia complicating the puerperium: Secondary | ICD-10-CM | POA: Diagnosis not present

## 2013-05-30 DIAGNOSIS — O99214 Obesity complicating childbirth: Secondary | ICD-10-CM

## 2013-05-30 DIAGNOSIS — D649 Anemia, unspecified: Secondary | ICD-10-CM | POA: Diagnosis not present

## 2013-05-30 DIAGNOSIS — E669 Obesity, unspecified: Secondary | ICD-10-CM | POA: Diagnosis present

## 2013-05-30 HISTORY — DX: Other specified health status: Z78.9

## 2013-05-30 LAB — TYPE AND SCREEN
ABO/RH(D): A POS
ANTIBODY SCREEN: NEGATIVE

## 2013-05-30 LAB — OB RESULTS CONSOLE GBS: GBS: NEGATIVE

## 2013-05-30 LAB — RPR: RPR Ser Ql: NONREACTIVE

## 2013-05-30 LAB — CBC
HCT: 37.9 % (ref 36.0–46.0)
HEMOGLOBIN: 12.9 g/dL (ref 12.0–15.0)
MCH: 27.7 pg (ref 26.0–34.0)
MCHC: 34 g/dL (ref 30.0–36.0)
MCV: 81.3 fL (ref 78.0–100.0)
Platelets: 223 10*3/uL (ref 150–400)
RBC: 4.66 MIL/uL (ref 3.87–5.11)
RDW: 14.7 % (ref 11.5–15.5)
WBC: 14 10*3/uL — AB (ref 4.0–10.5)

## 2013-05-30 LAB — ABO/RH: ABO/RH(D): A POS

## 2013-05-30 MED ORDER — ACETAMINOPHEN 325 MG PO TABS: 650.0000 mg | ORAL_TABLET | ORAL | Status: DC | PRN

## 2013-05-30 MED ORDER — PRENATAL MULTIVITAMIN CH
1.0000 | ORAL_TABLET | Freq: Every day | ORAL | Status: DC
  Administered 2013-05-30 – 2013-05-31 (×2): 1 via ORAL
  Filled 2013-05-30 (×2): qty 1

## 2013-05-30 MED ORDER — CITRIC ACID-SODIUM CITRATE 334-500 MG/5ML PO SOLN: 30.0000 mL | ORAL | Status: DC | PRN

## 2013-05-30 MED ORDER — DIPHENHYDRAMINE HCL 25 MG PO CAPS: 25.0000 mg | ORAL_CAPSULE | Freq: Four times a day (QID) | ORAL | Status: DC | PRN

## 2013-05-30 MED ORDER — OXYTOCIN BOLUS FROM INFUSION: 500.0000 mL | INTRAVENOUS | Status: DC

## 2013-05-30 MED ORDER — ZOLPIDEM TARTRATE 5 MG PO TABS: 5.0000 mg | ORAL_TABLET | Freq: Every evening | ORAL | Status: DC | PRN

## 2013-05-30 MED ORDER — TETANUS-DIPHTH-ACELL PERTUSSIS 5-2.5-18.5 LF-MCG/0.5 IM SUSP: 0.5000 mL | Freq: Once | INTRAMUSCULAR | Status: DC

## 2013-05-30 MED ORDER — FERROUS SULFATE 325 (65 FE) MG PO TABS
325.0000 mg | ORAL_TABLET | Freq: Two times a day (BID) | ORAL | Status: DC
  Administered 2013-05-30 – 2013-05-31 (×2): 325 mg via ORAL
  Filled 2013-05-30 (×2): qty 1

## 2013-05-30 MED ORDER — OXYCODONE-ACETAMINOPHEN 5-325 MG PO TABS
1.0000 | ORAL_TABLET | ORAL | Status: DC | PRN
Start: 2013-05-30 — End: 2013-05-30

## 2013-05-30 MED ORDER — ONDANSETRON HCL 4 MG PO TABS: 4.0000 mg | ORAL_TABLET | ORAL | Status: DC | PRN

## 2013-05-30 MED ORDER — SIMETHICONE 80 MG PO CHEW: 80.0000 mg | CHEWABLE_TABLET | ORAL | Status: DC | PRN

## 2013-05-30 MED ORDER — LACTATED RINGERS IV SOLN: INTRAVENOUS | Status: DC

## 2013-05-30 MED ORDER — SENNOSIDES-DOCUSATE SODIUM 8.6-50 MG PO TABS
2.0000 | ORAL_TABLET | ORAL | Status: DC
  Administered 2013-05-30: 2 via ORAL
  Filled 2013-05-30: qty 2

## 2013-05-30 MED ORDER — OXYTOCIN 40 UNITS IN LACTATED RINGERS INFUSION - SIMPLE MED: 62.5000 mL/h | INTRAVENOUS | Status: DC

## 2013-05-30 MED ORDER — WITCH HAZEL-GLYCERIN EX PADS: 1.0000 "application " | MEDICATED_PAD | CUTANEOUS | Status: DC | PRN

## 2013-05-30 MED ORDER — BENZOCAINE-MENTHOL 20-0.5 % EX AERO
1.0000 "application " | INHALATION_SPRAY | CUTANEOUS | Status: DC | PRN
  Filled 2013-05-30: qty 56

## 2013-05-30 MED ORDER — LACTATED RINGERS IV SOLN: 500.0000 mL | INTRAVENOUS | Status: DC | PRN

## 2013-05-30 MED ORDER — DIBUCAINE 1 % RE OINT: 1.0000 "application " | TOPICAL_OINTMENT | RECTAL | Status: DC | PRN

## 2013-05-30 MED ORDER — ONDANSETRON HCL 4 MG/2ML IJ SOLN: 4.0000 mg | INTRAMUSCULAR | Status: DC | PRN

## 2013-05-30 MED ORDER — LANOLIN HYDROUS EX OINT: TOPICAL_OINTMENT | CUTANEOUS | Status: DC | PRN

## 2013-05-30 MED ORDER — MEASLES, MUMPS & RUBELLA VAC ~~LOC~~ INJ
0.5000 mL | INJECTION | Freq: Once | SUBCUTANEOUS | Status: DC
Start: 2013-05-31 — End: 2013-05-31
  Filled 2013-05-30: qty 0.5

## 2013-05-30 MED ORDER — IBUPROFEN 600 MG PO TABS
600.0000 mg | ORAL_TABLET | Freq: Four times a day (QID) | ORAL | Status: DC | PRN
  Filled 2013-05-30: qty 1

## 2013-05-30 MED ORDER — ONDANSETRON HCL 4 MG/2ML IJ SOLN: 4.0000 mg | Freq: Four times a day (QID) | INTRAMUSCULAR | Status: DC | PRN

## 2013-05-30 MED ORDER — IBUPROFEN 600 MG PO TABS
600.0000 mg | ORAL_TABLET | Freq: Four times a day (QID) | ORAL | Status: DC
Start: 2013-05-30 — End: 2013-05-31
  Administered 2013-05-30 – 2013-05-31 (×4): 600 mg via ORAL
  Filled 2013-05-30 (×4): qty 1

## 2013-05-30 MED ORDER — LIDOCAINE HCL (PF) 1 % IJ SOLN
30.0000 mL | INTRAMUSCULAR | Status: DC | PRN
  Administered 2013-05-30: 30 mL via SUBCUTANEOUS
  Filled 2013-05-30: qty 30

## 2013-05-30 MED ORDER — OXYCODONE-ACETAMINOPHEN 5-325 MG PO TABS: 1.0000 | ORAL_TABLET | ORAL | Status: DC | PRN

## 2013-05-30 NOTE — H&P (Signed)
Janet Jacobs is a 33 y.o. female presenting for labor eval. Denies VB or LOF, +FM   Maternal Medical History:  Reason for admission: Contractions.   Contractions: Onset was 3-5 hours ago.   Frequency: regular.   Perceived severity is moderate.    Fetal activity: Perceived fetal activity is normal.   Last perceived fetal movement was within the past hour.    Prenatal complications: no prenatal complications Prenatal Complications - Diabetes: none.    OB History   Grav Para Term Preterm Abortions TAB SAB Ect Mult Living   4 1 1  0 2 2 0 0 0 1     Past Medical History  Diagnosis Date  . No pertinent past medical history   . Medical history non-contributory    Past Surgical History  Procedure Laterality Date  . Wisdom tooth extraction     Family History: family history is not on file. Social History:  reports that she has never smoked. She does not have any smokeless tobacco history on file. She reports that she does not drink alcohol or use illicit drugs.   Prenatal Transfer Tool  Maternal Diabetes: No Genetic Screening: Normal Maternal Ultrasounds/Referrals: Normal Fetal Ultrasounds or other Referrals:  None Maternal Substance Abuse:  No Significant Maternal Medications:  None Significant Maternal Lab Results:  Lab values include: Group B Strep negative Other Comments:  None  Review of Systems  All other systems reviewed and are negative.    Dilation: 5.5 Effacement (%): 80 Station: -2;Ballotable Exam by:: Janet Jacobs CNM Blood pressure 134/91, pulse 95, height 5\' 4"  (1.626 Jacobs), weight 281 lb 3.2 oz (127.551 kg), last menstrual period 08/20/2012. Maternal Exam:  Uterine Assessment: Contraction strength is moderate.  Contraction frequency is regular.   Abdomen: Patient reports no abdominal tenderness. Fundal height is aga.   Estimated fetal weight is 8#.   Fetal presentation: vertex  Introitus: Normal vulva. Normal vagina.  Pelvis: adequate for  delivery.   Cervix: Cervix evaluated by digital exam.     Fetal Exam Fetal Monitor Review: Mode: ultrasound.    Fetal State Assessment: Category I - tracings are normal.     Physical Exam  Nursing note and vitals reviewed. Constitutional: She is oriented to person, place, and time. She appears well-developed and well-nourished.  HENT:  Head: Normocephalic.  Eyes: Pupils are equal, round, and reactive to light.  Neck: Normal range of motion.  Cardiovascular: Normal rate, regular rhythm and normal heart sounds.   Respiratory: Effort normal and breath sounds normal.  GI: Soft. Bowel sounds are normal.  Genitourinary: Vagina normal.  Musculoskeletal: Normal range of motion.  Neurological: She is alert and oriented to person, place, and time. She has normal reflexes.  Skin: Skin is warm and dry.  Psychiatric: She has a normal mood and affect. Her behavior is normal.    Prenatal labs: ABO, Rh:   Antibody:   Rubella:   RPR:    HBsAg:    HIV:    GBS: Negative (02/24 0000)   Assessment/Plan: IUP at [redacted]w[redacted]d Active labor GBS neg FHR cat 1 Desires WB Initially elevated BP Obesity  Admit to b.s per c/w Dr Janet Jacobs Routine L&D orders Consider [facility] labs if BP's remain elevated    Janet Jacobs 05/30/2013, 6:49 AM

## 2013-05-30 NOTE — Progress Notes (Signed)
Patient refused IV but will allow labs to be drawn. Lab at bedside now.

## 2013-05-30 NOTE — MAU Note (Signed)
Contractions since 4 am. Denies LOF or VB. Positive fetal movement. Denies complications with pregnancy. Desires water birth.

## 2013-05-30 NOTE — Lactation Note (Signed)
This note was copied from the chart of [facility]. Lactation Consultation Note  Patient Name: Janet Jacobs [id number] Date: 05/30/2013 Reason for consult: Initial assessment of this experienced second-time mother who breastfed her 33 yo for 18 months and states that her newborn is latching well.  Initial LATCH score after delivery = 10.  LC discussed and encouraged STS and cue feedings, as well as hand expression and nipple care. LC encouraged review of Baby and Me pp 9, 14 and 20-25 for STS and BF information. LC provided [employer] brochure and reviewed [facility] services and list of community and web site resources.     Maternal Data Formula Feeding for Exclusion: No Infant to breast within first hour of birth: Yes (breastfed for 50 minutes with LATCH score=10) Has patient been taught Hand Expression?: Yes (experienced mom; states she knows how to express colostrum on nipples) Does the patient have breastfeeding experience prior to this delivery?: Yes  Feeding Feeding Type: Breast Fed Length of feed: 10 min  LATCH Score/Interventions              initial LATCH score=10        Lactation Tools Discussed/Used   STS, cue feedings, hand expression  Consult Status Consult Status: Follow-up Date: 05/31/13 Follow-up type: In-patient    Junious Dresser [facility] 05/30/2013, 10:32 PM

## 2013-05-31 LAB — CBC
HEMATOCRIT: 32.8 % — AB (ref 36.0–46.0)
Hemoglobin: 10.7 g/dL — ABNORMAL LOW (ref 12.0–15.0)
MCH: 26.9 pg (ref 26.0–34.0)
MCHC: 32.6 g/dL (ref 30.0–36.0)
MCV: 82.4 fL (ref 78.0–100.0)
Platelets: 213 10*3/uL (ref 150–400)
RBC: 3.98 MIL/uL (ref 3.87–5.11)
RDW: 14.9 % (ref 11.5–15.5)
WBC: 12.5 10*3/uL — AB (ref 4.0–10.5)

## 2013-05-31 MED ORDER — OXYCODONE-ACETAMINOPHEN 5-325 MG PO TABS
1.0000 | ORAL_TABLET | ORAL | Status: DC | PRN
Start: 2013-05-31 — End: 2015-08-23

## 2013-05-31 MED ORDER — IBUPROFEN 800 MG PO TABS: 800.0000 mg | ORAL_TABLET | Freq: Three times a day (TID) | ORAL | Status: DC | PRN

## 2013-05-31 MED ORDER — DOCUSATE SODIUM 100 MG PO CAPS: 100.0000 mg | ORAL_CAPSULE | Freq: Two times a day (BID) | ORAL | Status: DC

## 2013-05-31 NOTE — Discharge Instructions (Signed)
Postpartum Depression and Baby Blues °The postpartum period begins right after the birth of a baby. During this time, there is often a great amount of joy and excitement. It is also a time of considerable changes in the life of the parent(s). Regardless of how many times a mother gives birth, each child brings new challenges and dynamics to the family. It is not unusual to have feelings of excitement accompanied by confusing shifts in moods, emotions, and thoughts. All mothers are at risk of developing postpartum depression or the "baby blues." These mood changes can occur right after giving birth, or they may occur many months after giving birth. The baby blues or postpartum depression can be mild or severe. Additionally, postpartum depression can resolve rather quickly, or it can be a long-term condition. °CAUSES °Elevated hormones and their rapid decline are thought to be a main cause of postpartum depression and the baby blues. There are a number of hormones that radically change during and after pregnancy. Estrogen and progesterone usually decrease immediately after delivering your baby. The level of thyroid hormone and various cortisol steroids also rapidly drop. Other factors that play a major role in these changes include major life events and genetics.  °RISK FACTORS °If you have any of the following risks for the baby blues or postpartum depression, know what symptoms to watch out for during the postpartum period. Risk factors that may increase the likelihood of getting the baby blues or postpartum depression include: °· Having a personal or family history of depression. °· Having depression while being pregnant. °· Having premenstrual or oral contraceptive-associated mood issues. °· Having exceptional life stress. °· Having marital conflict. °· Lacking a social support network. °· Having a baby with special needs. °· Having health problems such as diabetes. °SYMPTOMS °Baby blues symptoms include: °· Brief  fluctuations in mood, such as going from extreme happiness to sadness. °· Decreased concentration. °· Difficulty sleeping. °· Crying spells, tearfulness. °· Irritability. °· Anxiety. °Postpartum depression symptoms typically begin within the first month after giving birth. These symptoms include: °· Difficulty sleeping or excessive sleepiness. °· Marked weight loss. °· Agitation. °· Feelings of worthlessness. °· Lack of interest in activity or food. °Postpartum psychosis is a very concerning condition and can be dangerous. Fortunately, it is rare. Displaying any of the following symptoms is cause for immediate medical attention. Postpartum psychosis symptoms include: °· Hallucinations and delusions. °· Bizarre or disorganized behavior. °· Confusion or disorientation. °DIAGNOSIS  °A diagnosis is made by an evaluation of your symptoms. There are no medical or lab tests that lead to a diagnosis, but there are various questionnaires that a caregiver may use to identify those with the baby blues, postpartum depression, or psychosis. Often times, a screening tool called the [place] Postnatal Depression Scale is used to diagnose depression in the postpartum period.  °TREATMENT °The baby blues usually goes away on its own in 1 to 2 weeks. Social support is often all that is needed. You should be encouraged to get adequate sleep and rest. Occasionally, you may be given medicines to help you sleep.  °Postpartum depression requires treatment as it can last several months or longer if it is not treated. Treatment may include individual or group therapy, medicine, or both to address any social, physiological, and psychological factors that may play a role in the depression. Regular exercise, a healthy diet, rest, and social support may also be strongly recommended.  °Postpartum psychosis is more serious and needs treatment right away. Hospitalization is   often needed. °HOME CARE INSTRUCTIONS °· Get as much rest as you can. Nap  when the baby sleeps. °· Exercise regularly. Some women find yoga and walking to be beneficial. °· Eat a balanced and nourishing diet. °· Do little things that you enjoy. Have a cup of tea, take a bubble bath, read your favorite magazine, or listen to your favorite music. °· Avoid alcohol. °· Ask for help with household chores, cooking, grocery shopping, or running errands as needed. Do not try to do everything. °· Talk to people close to you about how you are feeling. Get support from your partner, family members, friends, or other new moms. °· Try to stay positive in how you think. Think about the things you are grateful for. °· Do not spend a lot of time alone. °· Only take medicine as directed by your caregiver. °· Keep all your postpartum appointments. °· Let your caregiver know if you have any concerns. °SEEK MEDICAL CARE IF: °You are having a reaction or problems with your medicine. °SEEK IMMEDIATE MEDICAL CARE IF: °· You have suicidal feelings. °· You feel you may harm the baby or someone else. °Document Released: 12/26/2003 Document Revised: 06/15/2011 Document Reviewed: 01/02/2013 °ExitCare® Patient Information ©2014 [employer]. °Postpartum Care After Vaginal Delivery °After you deliver your newborn (postpartum period), the usual stay in the hospital is 24 72 hours. If there were problems with your labor or delivery, or if you have other medical problems, you might be in the hospital longer.  °While you are in the hospital, you will receive help and instructions on how to care for yourself and your newborn during the postpartum period.  °While you are in the hospital: °· Be sure to tell your nurses if you have pain or discomfort, as well as where you feel the pain and what makes the pain worse. °· If you had an incision made near your vagina (episiotomy) or if you had some tearing during delivery, the nurses may put ice packs on your episiotomy or tear. The ice packs may help to reduce the pain and  swelling. °· If you are breastfeeding, you may feel uncomfortable contractions of your uterus for a couple of weeks. This is normal. The contractions help your uterus get back to normal size. °· It is normal to have some bleeding after delivery. °· For the first 1 3 days after delivery, the flow is red and the amount may be similar to a period. °· It is common for the flow to start and stop. °· In the first few days, you may pass some small clots. Let your nurses know if you begin to pass large clots or your flow increases. °· Do not  flush blood clots down the toilet before having the nurse look at them. °· During the next 3 10 days after delivery, your flow should become more watery and pink or brown-tinged in color. °· Ten to fourteen days after delivery, your flow should be a small amount of yellowish-white discharge. °· The amount of your flow will decrease over the first few weeks after delivery. Your flow may stop in 6 8 weeks. Most women have had their flow stop by 12 weeks after delivery. °· You should change your sanitary pads frequently. °· Wash your hands thoroughly with soap and water for at least 20 seconds after changing pads, using the toilet, or before holding or feeding your newborn. °· You should feel like you need to empty your bladder within the first   6 8 hours after delivery. °· In case you become weak, lightheaded, or faint, call your nurse before you get out of bed for the first time and before you take a shower for the first time. °· Within the first few days after delivery, your breasts may begin to feel tender and full. This is called engorgement. Breast tenderness usually goes away within 48 72 hours after engorgement occurs. You may also notice milk leaking from your breasts. If you are not breastfeeding, do not stimulate your breasts. Breast stimulation can make your breasts produce more milk. °· Spending as much time as possible with your newborn is very important. During this time,  you and your newborn can feel close and get to know each other. Having your newborn stay in your room (rooming in) will help to strengthen the bond with your newborn.  It will give you time to get to know your newborn and become comfortable caring for your newborn. °· Your hormones change after delivery. Sometimes the hormone changes can temporarily cause you to feel sad or tearful. These feelings should not last more than a few days. If these feelings last longer than that, you should talk to your caregiver. °· If desired, talk to your caregiver about methods of family planning or contraception. °· Talk to your caregiver about immunizations. Your caregiver may want you to have the following immunizations before leaving the hospital: °· Tetanus, diphtheria, and pertussis (Tdap) or tetanus and diphtheria (Td) immunization. It is very important that you and your family (including grandparents) or others caring for your newborn are up-to-date with the Tdap or Td immunizations. The Tdap or Td immunization can help protect your newborn from getting ill. °· Rubella immunization. °· Varicella (chickenpox) immunization. °· Influenza immunization. You should receive this annual immunization if you did not receive the immunization during your pregnancy. °Document Released: 01/18/2007 Document Revised: 12/16/2011 Document Reviewed: 11/18/2011 °ExitCare® Patient Information ©2014 [employer]. ° °

## 2013-05-31 NOTE — Discharge Summary (Signed)
Vaginal Delivery Discharge Summary  Janet Jacobs  DOB:    [birth year] MRN:    [id number] CSN:    [id number]  Date of admission:                  05/30/2013  Date of discharge:                   05/31/2013  Procedures this admission:  Date of Delivery: 05/30/2013  Normal spontaneous water birth by Dr. Katharine Look Rivard  Newborn Data:  Live born female  Birth Weight: 7 lb 15 oz (3600 g) APGAR: 8, 9  Home with mother. Name: [name] Circumcision Plan: No circumcision  History of Present Illness:  [name] is a 33 y.o. female, [phone], who presents at [redacted]w[redacted]d weeks gestation. The patient has been followed at the [facility] and Gynecology division of [facility]. She was admitted onset of labor. Her pregnancy has been complicated by: none.  Hospital course:  The patient was admitted for labor.   Her labor was not complicated. She proceeded to have a vaginal delivery of a healthy infant. Her delivery was not complicated. She had a water birth. Her postpartum course was not complicated.  She was discharged to home on postpartum day 1 doing well.  Feeding:  breast  Contraception:  condoms  Discharge hemoglobin:  Hemoglobin  Date Value Ref Range Status  05/31/2013 10.7* 12.0 - 15.0 g/dL Final     REPEATED TO VERIFY     DELTA CHECK NOTED     HCT  Date Value Ref Range Status  05/31/2013 32.8* 36.0 - 46.0 % Final    Discharge Physical Exam:   General: no distress Lochia: appropriate Uterine Fundus: firm Incision: NA DVT Evaluation: No evidence of DVT seen on physical exam.  Intrapartum Procedures: spontaneous vaginal delivery Postpartum Procedures: none Complications-Operative and Postpartum: Second degree perineal laceration  Discharge Diagnoses: Term Pregnancy-delivered and Anemia, obesity  Discharge Information:  Activity:           pelvic rest Diet:                routine Medications: PNV, Ibuprofen,  Colace, Iron and Percocet Condition:      stable and improved Instructions:   Postpartum Care After Vaginal Delivery  After you deliver your newborn (postpartum period), the usual stay in the hospital is 24 72 hours. If there were problems with your labor or delivery, or if you have other medical problems, you might be in the hospital longer.  While you are in the hospital, you will receive help and instructions on how to care for yourself and your newborn during the postpartum period.  While you are in the hospital:  Be sure to tell your nurses if you have pain or discomfort, as well as where you feel the pain and what makes the pain worse.  If you had an incision made near your vagina (episiotomy) or if you had some tearing during delivery, the nurses may put ice packs on your episiotomy or tear. The ice packs may help to reduce the pain and swelling.  If you are breastfeeding, you may feel uncomfortable contractions of your uterus for a couple of weeks. This is normal. The contractions help your uterus get back to normal size.  It is normal to have some bleeding after delivery.  For the first 1 3 days after delivery, the flow is red and the amount may be similar to a  period.  It is common for the flow to start and stop.  In the first few days, you may pass some small clots. Let your nurses know if you begin to pass large clots or your flow increases.  Do not  flush blood clots down the toilet before having the nurse look at them.  During the next 3 10 days after delivery, your flow should become more watery and pink or brown-tinged in color.  Ten to fourteen days after delivery, your flow should be a small amount of yellowish-white discharge.  The amount of your flow will decrease over the first few weeks after delivery. Your flow may stop in 6 8 weeks. Most women have had their flow stop by 12 weeks after delivery.  You should change your sanitary pads frequently.  Wash your  hands thoroughly with soap and water for at least 20 seconds after changing pads, using the toilet, or before holding or feeding your newborn.  You should feel like you need to empty your bladder within the first 6 8 hours after delivery.  In case you become weak, lightheaded, or faint, call your nurse before you get out of bed for the first time and before you take a shower for the first time.  Within the first few days after delivery, your breasts may begin to feel tender and full. This is called engorgement. Breast tenderness usually goes away within 48 72 hours after engorgement occurs. You may also notice milk leaking from your breasts. If you are not breastfeeding, do not stimulate your breasts. Breast stimulation can make your breasts produce more milk.  Spending as much time as possible with your newborn is very important. During this time, you and your newborn can feel close and get to know each other. Having your newborn stay in your room (rooming in) will help to strengthen the bond with your newborn. It will give you time to get to know your newborn and become comfortable caring for your newborn.  Your hormones change after delivery. Sometimes the hormone changes can temporarily cause you to feel sad or tearful. These feelings should not last more than a few days. If these feelings last longer than that, you should talk to your caregiver.  If desired, talk to your caregiver about methods of family planning or contraception.  Talk to your caregiver about immunizations. Your caregiver may want you to have the following immunizations before leaving the hospital:  Tetanus, diphtheria, and pertussis (Tdap) or tetanus and diphtheria (Td) immunization. It is very important that you and your family (including grandparents) or others caring for your newborn are up-to-date with the Tdap or Td immunizations. The Tdap or Td immunization can help protect your newborn from getting ill.  Rubella  immunization.  Varicella (chickenpox) immunization.  Influenza immunization. You should receive this annual immunization if you did not receive the immunization during your pregnancy. Document Released: 01/18/2007 Document Revised: 12/16/2011 Document Reviewed: 11/18/2011 [facility] Patient Information 2014 [place].   Postpartum Depression and Baby Blues  The postpartum period begins right after the birth of a baby. During this time, there is often a great amount of joy and excitement. It is also a time of considerable changes in the life of the parent(s). Regardless of how many times a mother gives birth, each child brings new challenges and dynamics to the family. It is not unusual to have feelings of excitement accompanied by confusing shifts in moods, emotions, and thoughts. All mothers are at risk of  developing postpartum depression or the "baby blues." These mood changes can occur right after giving birth, or they may occur many months after giving birth. The baby blues or postpartum depression can be mild or severe. Additionally, postpartum depression can resolve rather quickly, or it can be a long-term condition. CAUSES Elevated hormones and their rapid decline are thought to be a main cause of postpartum depression and the baby blues. There are a number of hormones that radically change during and after pregnancy. Estrogen and progesterone usually decrease immediately after delivering your baby. The level of thyroid hormone and various cortisol steroids also rapidly drop. Other factors that play a major role in these changes include major life events and genetics.  RISK FACTORS If you have any of the following risks for the baby blues or postpartum depression, know what symptoms to watch out for during the postpartum period. Risk factors that may increase the likelihood of getting the baby blues or postpartum depression include:  Havinga personal or family history of  depression.  Having depression while being pregnant.  Having premenstrual or oral contraceptive-associated mood issues.  Having exceptional life stress.  Having marital conflict.  Lacking a social support network.  Having a baby with special needs.  Having health problems such as diabetes. SYMPTOMS Baby blues symptoms include:  Brief fluctuations in mood, such as going from extreme happiness to sadness.  Decreased concentration.  Difficulty sleeping.  Crying spells, tearfulness.  Irritability.  Anxiety. Postpartum depression symptoms typically begin within the first month after giving birth. These symptoms include:  Difficulty sleeping or excessive sleepiness.  Marked weight loss.  Agitation.  Feelings of worthlessness.  Lack of interest in activity or food. Postpartum psychosis is a very concerning condition and can be dangerous. Fortunately, it is rare. Displaying any of the following symptoms is cause for immediate medical attention. Postpartum psychosis symptoms include:  Hallucinations and delusions.  Bizarre or disorganized behavior.  Confusion or disorientation. DIAGNOSIS  A diagnosis is made by an evaluation of your symptoms. There are no medical or lab tests that lead to a diagnosis, but there are various questionnaires that a caregiver may use to identify those with the baby blues, postpartum depression, or psychosis. Often times, a screening tool called the [place] Postnatal Depression Scale is used to diagnose depression in the postpartum period.  TREATMENT The baby blues usually goes away on its own in 1 to 2 weeks. Social support is often all that is needed. You should be encouraged to get adequate sleep and rest. Occasionally, you may be given medicines to help you sleep.  Postpartum depression requires treatment as it can last several months or longer if it is not treated. Treatment may include individual or group therapy, medicine, or both to  address any social, physiological, and psychological factors that may play a role in the depression. Regular exercise, a healthy diet, rest, and social support may also be strongly recommended.  Postpartum psychosis is more serious and needs treatment right away. Hospitalization is often needed. HOME CARE INSTRUCTIONS  Get as much rest as you can. Nap when the baby sleeps.  Exercise regularly. Some women find yoga and walking to be beneficial.  Eat a balanced and nourishing diet.  Do little things that you enjoy. Have a cup of tea, take a bubble bath, read your favorite magazine, or listen to your favorite music.  Avoid alcohol.  Ask for help with household chores, cooking, grocery shopping, or running errands as needed. Do not try to  do everything.  Talk to people close to you about how you are feeling. Get support from your partner, family members, friends, or other new moms.  Try to stay positive in how you think. Think about the things you are grateful for.  Do not spend a lot of time alone.  Only take medicine as directed by your caregiver.  Keep all your postpartum appointments.  Let your caregiver know if you have any concerns. SEEK MEDICAL CARE IF: You are having a reaction or problems with your medicine. SEEK IMMEDIATE MEDICAL CARE IF:  You have suicidal feelings.  You feel you may harm the baby or someone else. Document Released: 12/26/2003 Document Revised: 06/15/2011 Document Reviewed: 01/27/2011 [facility] Patient Information 2014 [place], [place].   Discharge to: home  Follow-up Information   Follow up with [facility] In 6 weeks.   Contact information:   [address] [place] [address]        Eli Hose 05/31/2013

## 2013-08-25 ENCOUNTER — Encounter (HOSPITAL_COMMUNITY): Payer: Self-pay | Admitting: Emergency Medicine

## 2013-08-25 ENCOUNTER — Emergency Department (INDEPENDENT_AMBULATORY_CARE_PROVIDER_SITE_OTHER)
Admission: EM | Admit: 2013-08-25 | Discharge: 2013-08-25 | Disposition: A | Discharge status: Home / Self Care | Payer: BC Managed Care – PPO | Source: Home / Self Care | Attending: Family Medicine | Admitting: Family Medicine

## 2013-08-25 DIAGNOSIS — J302 Other seasonal allergic rhinitis: Secondary | ICD-10-CM

## 2013-08-25 DIAGNOSIS — J309 Allergic rhinitis, unspecified: Secondary | ICD-10-CM

## 2013-08-25 MED ORDER — METHYLPREDNISOLONE ACETATE 40 MG/ML IJ SUSP
80.0000 mg | Freq: Once | INTRAMUSCULAR | Status: AC
  Administered 2013-08-25: 80 mg via INTRAMUSCULAR

## 2013-08-25 MED ORDER — IPRATROPIUM BROMIDE 0.06 % NA SOLN: 2.0000 | Freq: Four times a day (QID) | NASAL | Status: DC

## 2013-08-25 MED ORDER — METHYLPREDNISOLONE ACETATE 80 MG/ML IJ SUSP
INTRAMUSCULAR | Status: AC
  Filled 2013-08-25: qty 1

## 2013-08-25 MED ORDER — KETOTIFEN FUMARATE 0.025 % OP SOLN: 2.0000 [drp] | Freq: Two times a day (BID) | OPHTHALMIC | Status: DC

## 2013-08-25 NOTE — ED Notes (Signed)
C/o  Non productive cough.  Redness and drainage of the right eye.  X 1 wk.  Denies fever.  No otc meds taken for symptoms.

## 2013-08-25 NOTE — ED Provider Notes (Signed)
CSN: [id number]     Arrival date & time 08/25/13  8413 History   First MD Initiated Contact with Patient 08/25/13 1019     Chief Complaint  Patient presents with  . Cough  . Conjunctivitis   (Consider location/radiation/quality/duration/timing/severity/associated sxs/prior Treatment) Patient is a 33 y.o. female presenting with cough. The history is provided by the patient.  Cough Cough characteristics:  Productive Sputum characteristics:  Clear Severity:  Mild Onset quality:  Gradual Duration:  1 week Progression:  Unchanged Chronicity:  New Smoker: no   Context: exposure to allergens and weather changes   Associated symptoms: rhinorrhea   Associated symptoms: no eye discharge, no fever and no wheezing     Past Medical History  Diagnosis Date  . No pertinent past medical history   . Medical history non-contributory    Past Surgical History  Procedure Laterality Date  . Wisdom tooth extraction    . No past surgeries     History reviewed. No pertinent family history. History  Substance Use Topics  . Smoking status: Never Smoker   . Smokeless tobacco: Not on file  . Alcohol Use: No   OB History   Grav Para Term Preterm Abortions TAB SAB Ect Mult Living   4 2 2  0 2 2 0 0 0 2     Review of Systems  Constitutional: Negative.  Negative for fever.  HENT: Positive for congestion, postnasal drip, rhinorrhea and sneezing.   Eyes: Positive for redness and itching. Negative for photophobia, pain, discharge and visual disturbance.  Respiratory: Positive for cough. Negative for wheezing.     Allergies  Latex  Home Medications   Prior to Admission medications   Medication Sig Start Date End Date Taking? Authorizing Provider  cetirizine (ZYRTEC) 10 MG tablet Take 10 mg by mouth as needed for allergies.    Historical Provider, MD  docusate sodium (COLACE) 100 MG capsule Take 1 capsule (100 mg total) by mouth 2 (two) times daily. 05/31/13   Ena Dawley, MD  ibuprofen  (ADVIL,MOTRIN) 800 MG tablet Take 1 tablet (800 mg total) by mouth every 8 (eight) hours as needed. 05/31/13   Ena Dawley, MD  oxyCODONE-acetaminophen (ROXICET) 5-325 MG per tablet Take 1 tablet by mouth every 4 (four) hours as needed for severe pain. 05/31/13   Ena Dawley, MD  Prenatal Vit-Fe Fumarate-FA (PRENATAL MULTIVITAMIN) TABS Take 1 tablet by mouth daily.    Historical Provider, MD   BP 120/86  Pulse 76  Temp(Src) 98.4 F (36.9 C) (Oral)  Resp 16  SpO2 98%  LMP 07/26/2013  Breastfeeding? No Physical Exam  Nursing note and vitals reviewed. Constitutional: She is oriented to person, place, and time. She appears well-developed and well-nourished. No distress.  HENT:  Head: Normocephalic.  Right Ear: External ear normal.  Left Ear: External ear normal.  Nose: Mucosal edema and rhinorrhea present.  Mouth/Throat: Oropharynx is clear and moist.  Eyes: EOM are normal. Pupils are equal, round, and reactive to light. Right conjunctiva is injected. Left conjunctiva is not injected.  Neck: Normal range of motion. Neck supple.  Cardiovascular: Normal heart sounds.   Pulmonary/Chest: Effort normal and breath sounds normal.  Lymphadenopathy:    She has no cervical adenopathy.  Neurological: She is alert and oriented to person, place, and time.  Skin: Skin is warm and dry.    ED Course  Procedures (including critical care time) Labs Review Labs Reviewed - No data to display  Imaging Review No results found.  MDM   1. Seasonal allergic reaction        Billy Fischer, MD 08/25/13 1102

## 2013-11-05 IMAGING — US US OB FOLLOW-UP
1 series · 13 of 28 positions shown · non-contrast
Comparison: none

[Series 1: us ob follow-up · 0.23mm/px · 13 of 56 slices shown]
[im 3/56]
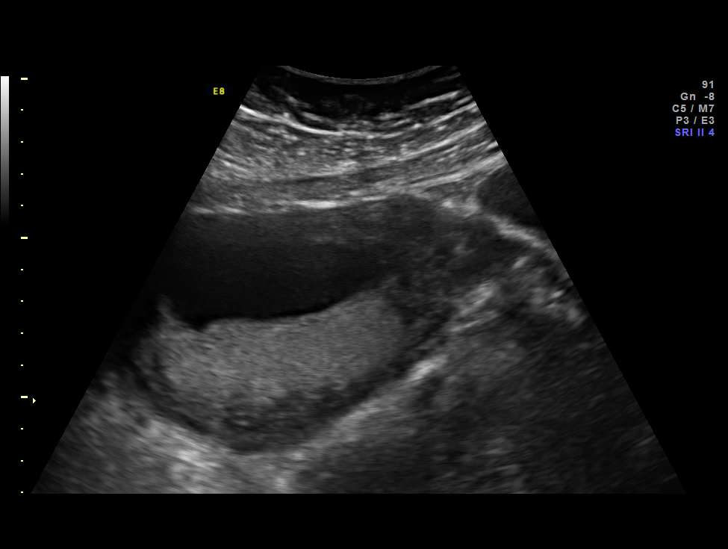
[im 7/56]
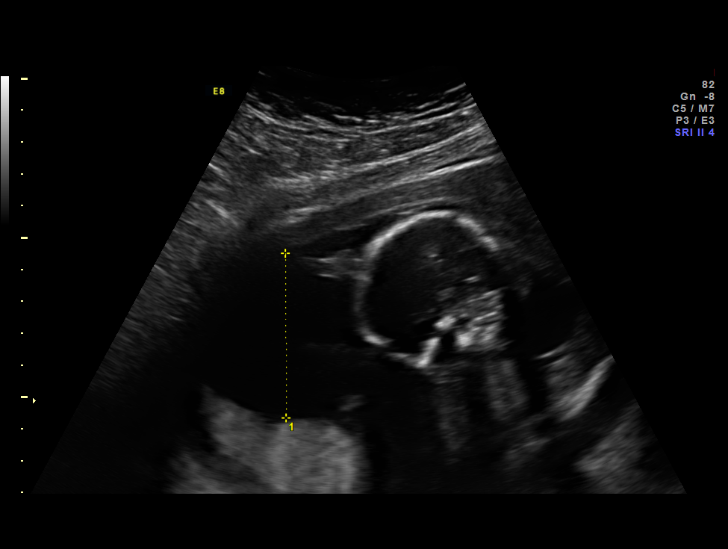
[im 11/56]
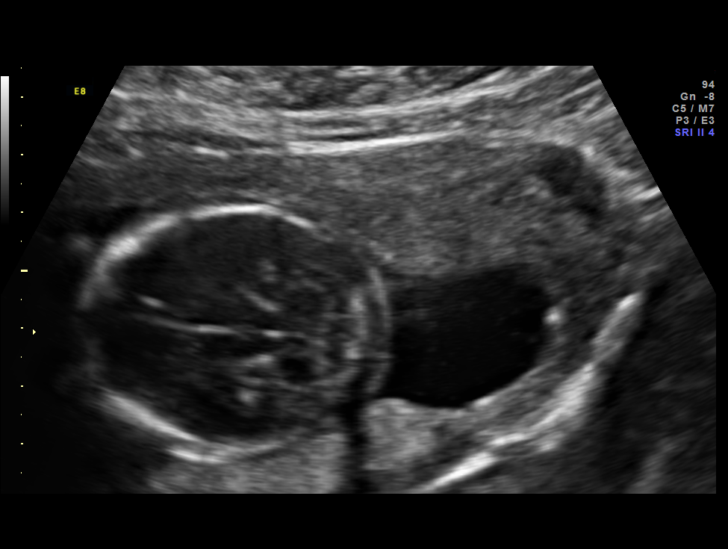
[im 15/56]
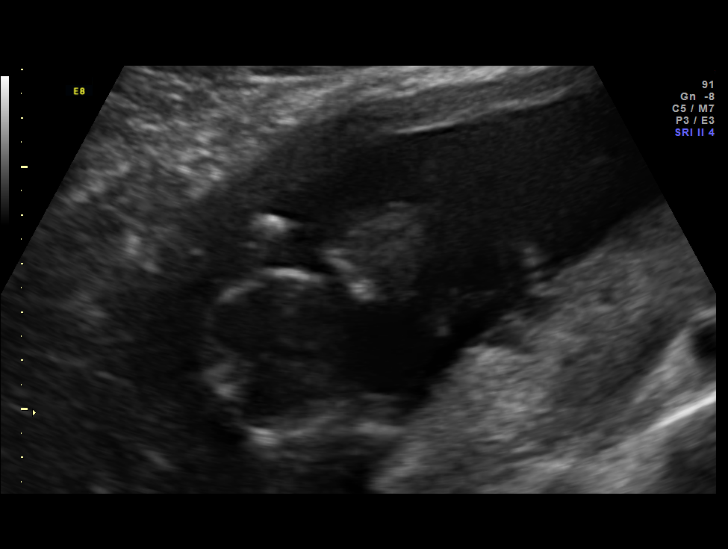
[im 19/56]
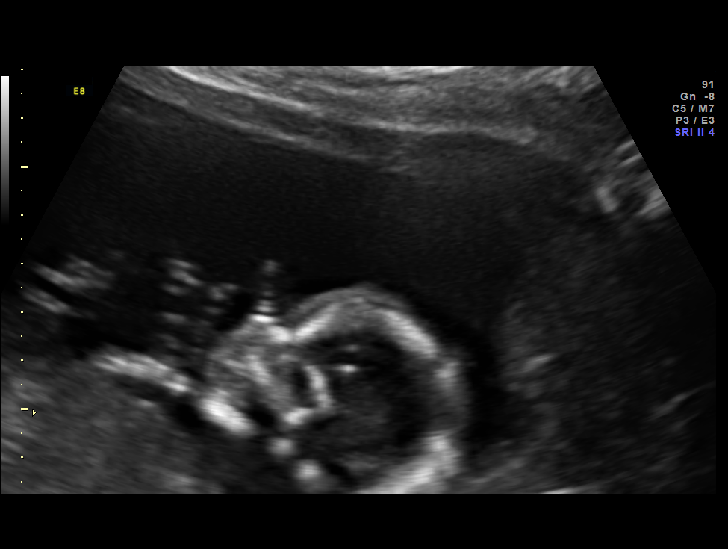
[im 23/56]
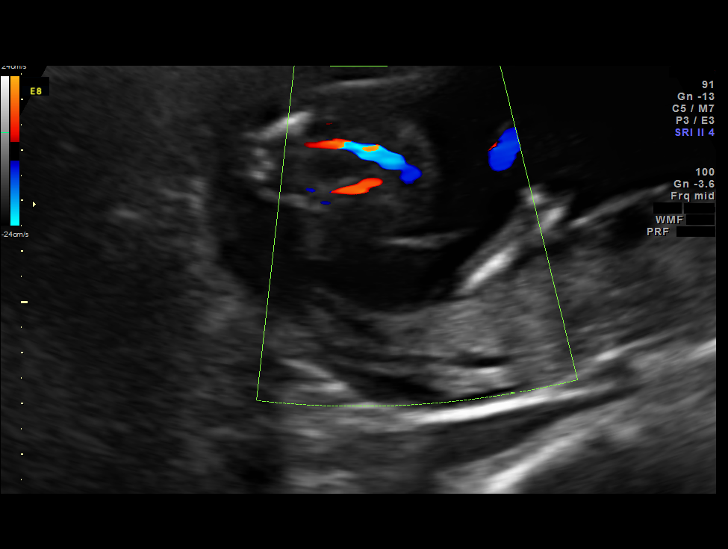
[im 29/56]
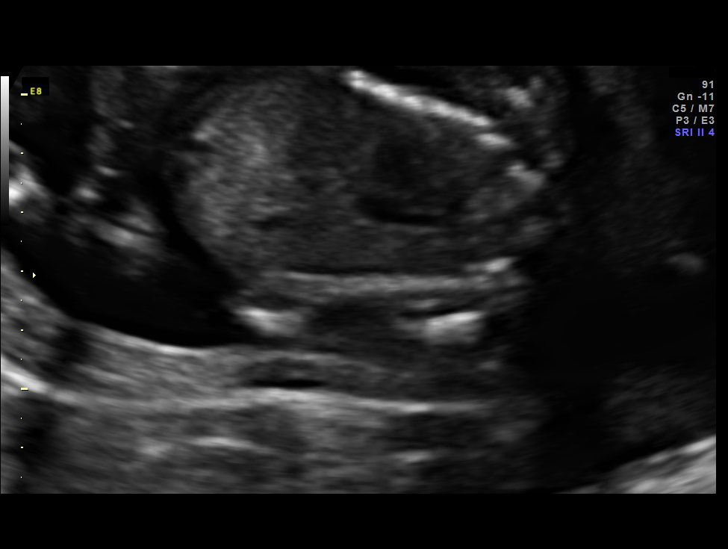
[im 33/56]
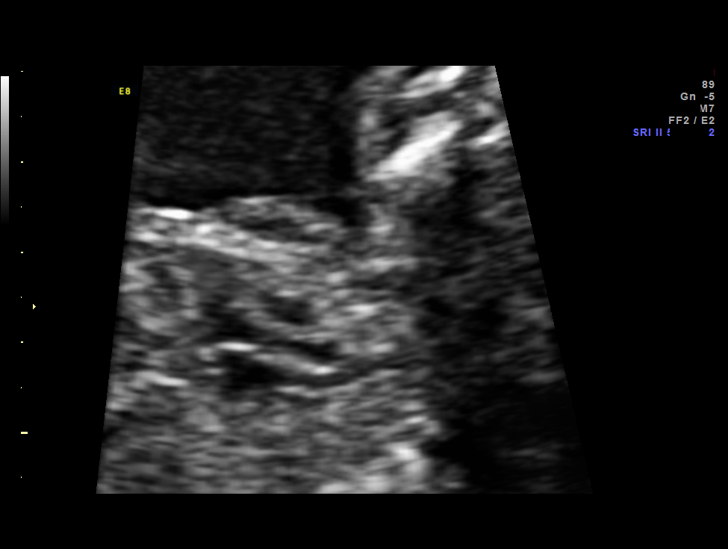
[im 37/56]
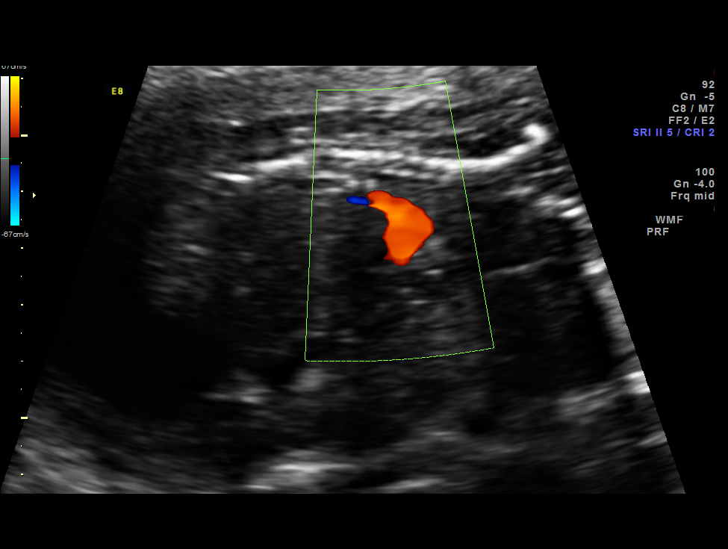
[im 41/56]
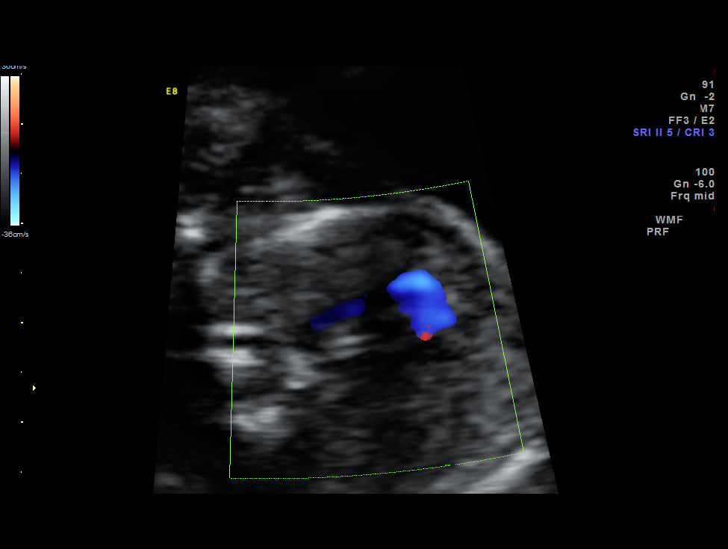
[im 45/56]
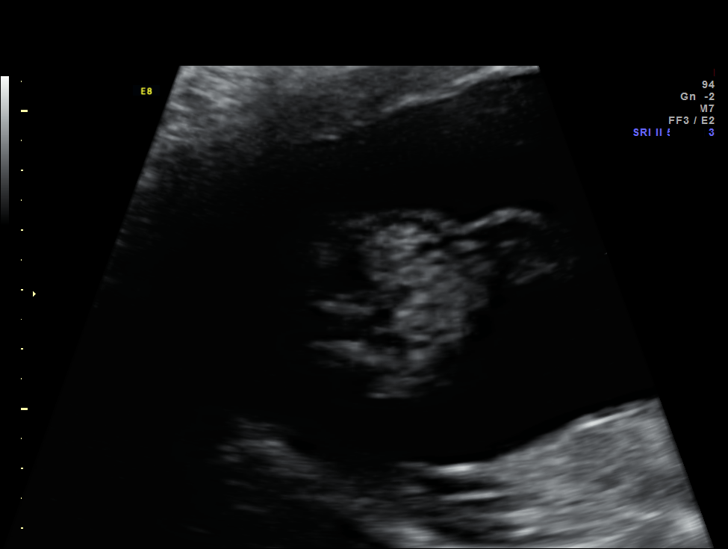
[im 49/56]
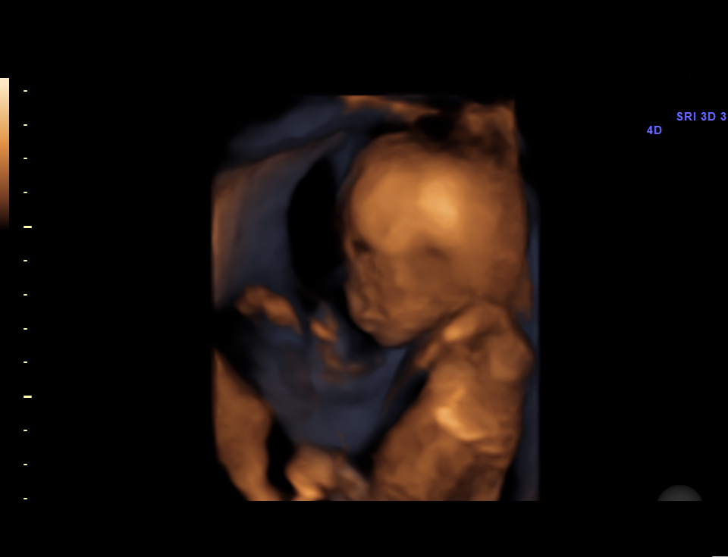
[im 53/56]
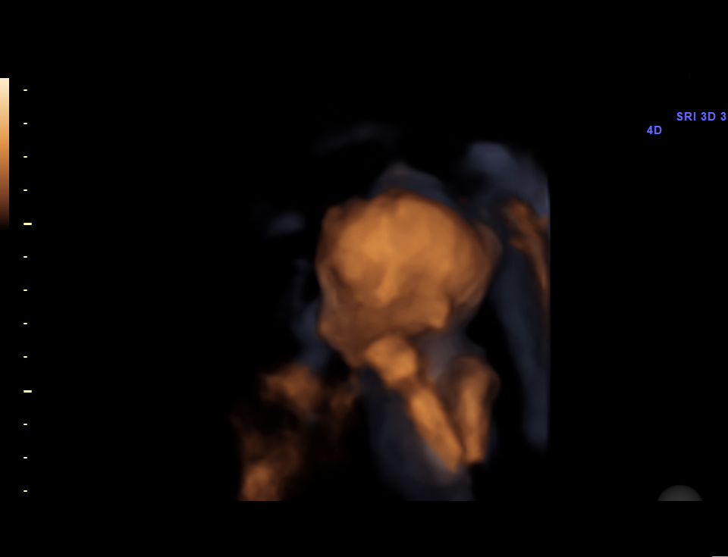

[13 of 28 positions shown; findings below may reference images not displayed]

OBSTETRICS REPORT
                      (Signed Final 12/30/2012 [DATE])

Service(s) Provided

 US OB FOLLOW UP                                       76816.1
Indications

 Fetal abnormality - other known or suspected -
 increased NT/cystic hygroma
 Normal amnio results
Fetal Evaluation

 Num Of Fetuses:    1
 Fetal Heart Rate:  155                          bpm
 Cardiac Activity:  Observed
 Presentation:      Cephalic
 Placenta:          Posterior, above cervical
                    os
 P. Cord            Previously Visualized
 Insertion:

 Amniotic Fluid
 AFI FV:      Subjectively within normal limits
                                             Larg Pckt:     5.2  cm
Gestational Age

 LMP:           18w 6d        Date:  08/20/12                 EDD:   05/27/13
 Best:          18w 6d     Det. By:  LMP  (08/20/12)          EDD:   05/27/13
Anatomy

 Cranium:          Appears normal         Aortic Arch:      Appears normal
 Fetal Cavum:      Appears normal         Ductal Arch:      Appears normal
 Ventricles:       Appears normal         Diaphragm:        Appears normal
 Choroid Plexus:   Appears normal         Stomach:          Appears normal, left
                                                            sided
 Cerebellum:       Appears normal         Abdomen:          Appears normal
 Posterior Fossa:  Appears normal         Abdominal Wall:   Appears nml (cord
                                                            insert, abd wall)
 Nuchal Fold:      Appears normal         Cord Vessels:     Appears normal (3
                                                            vessel cord)
 Face:             Appears normal         Kidneys:          Appear normal
                   (orbits and profile)
 Lips:             Appears normal         Bladder:          Appears normal
 Heart:            Appears normal         Spine:            Previously seen
                   (4CH, axis, and
                   situs)
 RVOT:             Appears normal         Lower             Previously seen
                                          Extremities:
 LVOT:             Appears normal         Upper             Previously seen
                                          Extremities:

 Other:  Male gender. Heels and 5th digit previously seen.
Cervix Uterus Adnexa

 Cervix:       Normal appearance by transabdominal scan. Appears
               closed, without funnelling.
Impression

 IUP at 18+2 weeks
 Normal interval anatomy; anatomic survey complete
 Normal amniotic fluid volume

Recommendations

 Fetal ECHO scheduled
 Serial ultrasounds for growth starting in the early third
 trimester

 questions or concerns.

## 2014-01-26 ENCOUNTER — Encounter (HOSPITAL_COMMUNITY): Payer: Self-pay | Admitting: Emergency Medicine

## 2014-01-26 ENCOUNTER — Emergency Department (HOSPITAL_COMMUNITY)
Admission: EM | Admit: 2014-01-26 | Discharge: 2014-01-26 | Disposition: A | Discharge status: Home / Self Care | Payer: BC Managed Care – PPO | Source: Home / Self Care | Attending: Family Medicine | Admitting: Family Medicine

## 2014-01-26 DIAGNOSIS — R071 Chest pain on breathing: Secondary | ICD-10-CM

## 2014-01-26 DIAGNOSIS — R0789 Other chest pain: Secondary | ICD-10-CM

## 2014-01-26 MED ORDER — MELOXICAM 7.5 MG PO TABS: 7.5000 mg | ORAL_TABLET | Freq: Two times a day (BID) | ORAL | Status: DC

## 2014-01-26 NOTE — ED Provider Notes (Signed)
CSN: [id number]     Arrival date & time 01/26/14  9563 History   First MD Initiated Contact with Patient 01/26/14 1038     Chief Complaint  Patient presents with  . Shortness of Breath   (Consider location/radiation/quality/duration/timing/severity/associated sxs/prior Treatment) Patient is a 33 y.o. female presenting with shortness of breath. The history is provided by the patient.  Shortness of Breath Severity:  Mild Onset quality:  Gradual Duration:  1 day Progression:  Unchanged Chronicity:  New Context: URI   Context comment:  Onset during sleep, has had recent uri last week. Worsened by:  Coughing, movement and deep breathing Associated symptoms: chest pain and cough   Associated symptoms: no fever, no vomiting and no wheezing     Past Medical History  Diagnosis Date  . No pertinent past medical history   . Medical history non-contributory    Past Surgical History  Procedure Laterality Date  . Wisdom tooth extraction    . No past surgeries     History reviewed. No pertinent family history. History  Substance Use Topics  . Smoking status: Never Smoker   . Smokeless tobacco: Not on file  . Alcohol Use: No   OB History   Grav Para Term Preterm Abortions TAB SAB Ect Mult Living   4 2 2  0 2 2 0 0 0 2     Review of Systems  Constitutional: Negative.  Negative for fever.  Respiratory: Positive for cough and shortness of breath. Negative for wheezing.   Cardiovascular: Positive for chest pain. Negative for palpitations and leg swelling.  Gastrointestinal: Negative for vomiting.    Allergies  Latex  Home Medications   Prior to Admission medications   Medication Sig Start Date End Date Taking? Authorizing Provider  cetirizine (ZYRTEC) 10 MG tablet Take 10 mg by mouth as needed for allergies.    Historical Provider, MD  docusate sodium (COLACE) 100 MG capsule Take 1 capsule (100 mg total) by mouth 2 (two) times daily. 05/31/13   Ena Dawley, MD  ibuprofen  (ADVIL,MOTRIN) 800 MG tablet Take 1 tablet (800 mg total) by mouth every 8 (eight) hours as needed. 05/31/13   Ena Dawley, MD  ipratropium (ATROVENT) 0.06 % nasal spray Place 2 sprays into the nose 4 (four) times daily. 08/25/13   Billy Fischer, MD  ketotifen (ZADITOR) 0.025 % ophthalmic solution Place 2 drops into the right eye 2 (two) times daily. 08/25/13   Billy Fischer, MD  meloxicam (MOBIC) 7.5 MG tablet Take 1 tablet (7.5 mg total) by mouth 2 (two) times daily after a meal. 01/26/14   Billy Fischer, MD  oxyCODONE-acetaminophen (PERCOCET) 5-325 MG per tablet Take 1 tablet by mouth every 4 (four) hours as needed for moderate pain. 87/56/43   Delora Fuel, MD  oxyCODONE-acetaminophen (ROXICET) 5-325 MG per tablet Take 1 tablet by mouth every 4 (four) hours as needed for severe pain. 05/31/13   Ena Dawley, MD  Prenatal Vit-Fe Fumarate-FA (PRENATAL MULTIVITAMIN) TABS Take 1 tablet by mouth daily.    Historical Provider, MD   BP 131/60  Pulse 80  Temp(Src) 98.6 F (37 C) (Oral)  Resp 16  SpO2 100%  LMP 01/04/2014 Physical Exam  Nursing note and vitals reviewed. Constitutional: She is oriented to person, place, and time. She appears well-developed and well-nourished.  HENT:  Right Ear: External ear normal.  Left Ear: External ear normal.  Mouth/Throat: Oropharynx is clear and moist.  Eyes: Pupils are equal, round, and reactive to  light.  Neck: Normal range of motion. Neck supple.  Cardiovascular: Regular rhythm and normal heart sounds.   Pulmonary/Chest: Effort normal and breath sounds normal.  Neurological: She is alert and oriented to person, place, and time.  Skin: Skin is warm and dry.    ED Course  Procedures (including critical care time) Labs Review Labs Reviewed - No data to display  Imaging Review Dg Chest 2 View  01/27/2014   CLINICAL DATA:  Shortness of breath for 2 days. Pain in the central chest when taking a deep breath.  EXAM: CHEST  2 VIEW  COMPARISON:   None.  FINDINGS: Shallow inspiration. The heart size and mediastinal contours are within normal limits. Both lungs are clear. The visualized skeletal structures are unremarkable.  IMPRESSION: No active cardiopulmonary disease.   Electronically Signed   By: Lucienne Capers M.D.   On: 01/27/2014 06:18     MDM   1. Anterior chest wall pain        Billy Fischer, MD 01/28/14 1050

## 2014-01-26 NOTE — ED Notes (Signed)
Pt  Reports  Shortness  Of  Breath      With  Pain in  Her  Chest on  Inspiration             She  Reports  Feels    Lightheaded      At  Times           She   Reports      Symptoms started  Last  Pm      She  Is  Sitting upright on  The  Exam table  Speaking in  Complete  sentances   Appearing in no acute  Distress     Skin is  Warm  And  Dry

## 2014-01-27 ENCOUNTER — Emergency Department (HOSPITAL_BASED_OUTPATIENT_CLINIC_OR_DEPARTMENT_OTHER)
Admission: EM | Admit: 2014-01-27 | Discharge: 2014-01-27 | Disposition: A | Discharge status: Home / Self Care | Payer: BC Managed Care – PPO | Attending: Emergency Medicine | Admitting: Emergency Medicine

## 2014-01-27 ENCOUNTER — Emergency Department (HOSPITAL_BASED_OUTPATIENT_CLINIC_OR_DEPARTMENT_OTHER): Payer: BC Managed Care – PPO

## 2014-01-27 ENCOUNTER — Encounter (HOSPITAL_BASED_OUTPATIENT_CLINIC_OR_DEPARTMENT_OTHER): Payer: Self-pay | Admitting: Emergency Medicine

## 2014-01-27 DIAGNOSIS — Z791 Long term (current) use of non-steroidal anti-inflammatories (NSAID): Secondary | ICD-10-CM | POA: Diagnosis not present

## 2014-01-27 DIAGNOSIS — R0602 Shortness of breath: Secondary | ICD-10-CM

## 2014-01-27 DIAGNOSIS — R091 Pleurisy: Secondary | ICD-10-CM | POA: Insufficient documentation

## 2014-01-27 DIAGNOSIS — Z79899 Other long term (current) drug therapy: Secondary | ICD-10-CM | POA: Diagnosis not present

## 2014-01-27 DIAGNOSIS — Z91040 Latex allergy status: Secondary | ICD-10-CM | POA: Diagnosis not present

## 2014-01-27 LAB — D-DIMER, QUANTITATIVE (NOT AT ARMC): D DIMER QUANT: 0.4 ug{FEU}/mL (ref 0.00–0.48)

## 2014-01-27 MED ORDER — OXYCODONE-ACETAMINOPHEN 5-325 MG PO TABS: 1.0000 | ORAL_TABLET | ORAL | Status: DC | PRN

## 2014-01-27 MED ORDER — DEXAMETHASONE 4 MG PO TABS
12.0000 mg | ORAL_TABLET | Freq: Once | ORAL | Status: AC
  Administered 2014-01-27: 12 mg via ORAL
  Filled 2014-01-27: qty 3

## 2014-01-27 NOTE — ED Provider Notes (Signed)
CSN: [id number]     Arrival date & time 01/27/14  0532 History   First MD Initiated Contact with Patient 01/27/14 0600     Chief Complaint  Patient presents with  . Shortness of Breath     (Consider location/radiation/quality/duration/timing/severity/associated sxs/prior Treatment) Patient is a 33 y.o. female presenting with shortness of breath. The history is provided by the patient.  Shortness of Breath She has been short of breath and also having the pleuritic chest pain for the last 2 days. Pain is an 83 anterior chest and she rates it at 7/10. There's been no fever or chills. She's had some slight nasal congestion. She states she did have a cold last week which had resolved. She's had no other sick contacts. She went to urgent care yesterday where she was given a prescription for Roxicodone but she states she has not had a prescription filled. She has been taking ibuprofen with no relief. She denies history of recent surgery, recent travel, oral contraceptive use, or tobacco use.  Past Medical History  Diagnosis Date  . No pertinent past medical history   . Medical history non-contributory    Past Surgical History  Procedure Laterality Date  . Wisdom tooth extraction    . No past surgeries     No family history on file. History  Substance Use Topics  . Smoking status: Never Smoker   . Smokeless tobacco: Not on file  . Alcohol Use: No   OB History   Grav Para Term Preterm Abortions TAB SAB Ect Mult Living   4 2 2  0 2 2 0 0 0 2     Review of Systems  Respiratory: Positive for shortness of breath.   All other systems reviewed and are negative.     Allergies  Latex  Home Medications   Prior to Admission medications   Medication Sig Start Date End Date Taking? Authorizing Provider  cetirizine (ZYRTEC) 10 MG tablet Take 10 mg by mouth as needed for allergies.    Historical Provider, MD  docusate sodium (COLACE) 100 MG capsule Take 1 capsule (100 mg total) by mouth 2  (two) times daily. 05/31/13   Ena Dawley, MD  ibuprofen (ADVIL,MOTRIN) 800 MG tablet Take 1 tablet (800 mg total) by mouth every 8 (eight) hours as needed. 05/31/13   Ena Dawley, MD  ipratropium (ATROVENT) 0.06 % nasal spray Place 2 sprays into the nose 4 (four) times daily. 08/25/13   Billy Fischer, MD  ketotifen (ZADITOR) 0.025 % ophthalmic solution Place 2 drops into the right eye 2 (two) times daily. 08/25/13   Billy Fischer, MD  meloxicam (MOBIC) 7.5 MG tablet Take 1 tablet (7.5 mg total) by mouth 2 (two) times daily after a meal. 01/26/14   Billy Fischer, MD  oxyCODONE-acetaminophen (ROXICET) 5-325 MG per tablet Take 1 tablet by mouth every 4 (four) hours as needed for severe pain. 05/31/13   Ena Dawley, MD  Prenatal Vit-Fe Fumarate-FA (PRENATAL MULTIVITAMIN) TABS Take 1 tablet by mouth daily.    Historical Provider, MD   BP 137/83  Pulse 87  Temp(Src) 98.2 F (36.8 C) (Oral)  Resp 20  SpO2 99%  LMP 01/04/2014 Physical Exam  Nursing note and vitals reviewed.  33 year old female, resting comfortably and in no acute distress. Vital signs are normal. Oxygen saturation is 99%, which is normal. Head is normocephalic and atraumatic. PERRLA, EOMI. Oropharynx is clear. Neck is nontender and supple without adenopathy or JVD. Back is nontender  and there is no CVA tenderness. Lungs are clear without rales, wheezes, or rhonchi. Chest is moderately tender in the parasternal area bilaterally but this does not reproduce her pain. Heart has regular rate and rhythm without murmur. Abdomen is soft, flat, nontender without masses or hepatosplenomegaly and peristalsis is normoactive. Extremities have no cyanosis or edema, full range of motion is present. Skin is warm and dry without rash. Neurologic: Mental status is normal, cranial nerves are intact, there are no motor or sensory deficits.  ED Course  Procedures (including critical care time) Labs Review Results for orders placed during  the hospital encounter of 01/27/14  D-DIMER, QUANTITATIVE      Result Value Ref Range   D-Dimer, Quant 0.40  0.00 - 0.48 ug/mL-FEU   Imaging Review Dg Chest 2 View  01/27/2014   CLINICAL DATA:  Shortness of breath for 2 days. Pain in the central chest when taking a deep breath.  EXAM: CHEST  2 VIEW  COMPARISON:  None.  FINDINGS: Shallow inspiration. The heart size and mediastinal contours are within normal limits. Both lungs are clear. The visualized skeletal structures are unremarkable.  IMPRESSION: No active cardiopulmonary disease.   Electronically Signed   By: Lucienne Capers M.D.   On: 01/27/2014 06:18    Images viewed by me.  MDM   Final diagnoses:  Pleurisy    Pleuritic chest pain which is most likely viral pleurisy. D-dimer will be obtained to rule out pulmonary embolism. Old records are reviewed and she was seen in urgent care yesterday and diagnosed with anterior chest wall pain and prescribed meloxicam.  D-dimer is negative and chest x-ray shows no evidence of pneumonia. No evidence of any pathology other than viral pleurisy. She is given a dose of dexamethasone and discharged with a prescription for oxycodone and acetaminophen for pain. Followup with family doctor if not improving over the next 2-3 days.  Delora Fuel, MD 23/55/73 2202

## 2014-01-27 NOTE — Discharge Instructions (Signed)
Continue taking ibuprofen or meloxicam.  Pleurisy Pleurisy is an inflammation and swelling of the lining of the lungs (pleura). Because of this inflammation, it hurts to breathe. It can be aggravated by coughing, laughing, or deep breathing. Pleurisy is often caused by an underlying infection or disease.  HOME CARE INSTRUCTIONS  Monitor your pleurisy for any changes. The following actions may help to alleviate any discomfort you are experiencing:  Medicine may help with pain. Only take over-the-counter or prescription medicines for pain, discomfort, or fever as directed by your health care provider.  Only take antibiotic medicine as directed. Make sure to finish it even if you start to feel better. SEEK MEDICAL CARE IF:   Your pain is not controlled with medicine or is increasing.  You have an increase in pus-like (purulent) secretions brought up with coughing. SEEK IMMEDIATE MEDICAL CARE IF:   You have blue or dark lips, fingernails, or toenails.  You are coughing up blood.  You have increased difficulty breathing.  You have continuing pain unrelieved by medicine or pain lasting more than 1 week.  You have pain that radiates into your neck, arms, or jaw.  You develop increased shortness of breath or wheezing.  You develop a fever, rash, vomiting, fainting, or other serious symptoms. MAKE SURE YOU:  Understand these instructions.   Will watch your condition.   Will get help right away if you are not doing well or get worse.  Document Released: 03/23/2005 Document Revised: 11/23/2012 Document Reviewed: 09/04/2012 [facility] Patient Information 2015 [place], [place]. This information is not intended to replace advice given to you by your health care provider. Make sure you discuss any questions you have with your health care provider.  Acetaminophen; Oxycodone tablets What is this medicine? ACETAMINOPHEN; OXYCODONE (a set a MEE noe fen; ox i KOE done) is a pain reliever. It  is used to treat mild to moderate pain. This medicine may be used for other purposes; ask your health care provider or pharmacist if you have questions. COMMON BRAND NAME(S): Endocet, Magnacet, Narvox, Percocet, Perloxx, Primalev, Primlev, Roxicet, Xolox What should I tell my health care provider before I take this medicine? They need to know if you have any of these conditions: -brain tumor -Crohn's disease, inflammatory bowel disease, or ulcerative colitis -drug abuse or addiction -head injury -heart or circulation problems -if you often drink alcohol -kidney disease or problems going to the bathroom -liver disease -lung disease, asthma, or breathing problems -an unusual or allergic reaction to acetaminophen, oxycodone, other opioid analgesics, other medicines, foods, dyes, or preservatives -pregnant or trying to get pregnant -breast-feeding How should I use this medicine? Take this medicine by mouth with a full glass of water. Follow the directions on the prescription label. Take your medicine at regular intervals. Do not take your medicine more often than directed. Talk to your pediatrician regarding the use of this medicine in children. Special care may be needed. Patients over 46 years old may have a stronger reaction and need a smaller dose. Overdosage: If you think you have taken too much of this medicine contact a poison control center or emergency room at once. NOTE: This medicine is only for you. Do not share this medicine with others. What if I miss a dose? If you miss a dose, take it as soon as you can. If it is almost time for your next dose, take only that dose. Do not take double or extra doses. What may interact with this medicine? -alcohol -antihistamines -  barbiturates like amobarbital, butalbital, butabarbital, methohexital, pentobarbital, phenobarbital, thiopental, and secobarbital -benztropine -drugs for bladder problems like solifenacin, trospium, oxybutynin,  tolterodine, hyoscyamine, and methscopolamine -drugs for breathing problems like ipratropium and tiotropium -drugs for certain stomach or intestine problems like propantheline, homatropine methylbromide, glycopyrrolate, atropine, belladonna, and dicyclomine -general anesthetics like etomidate, ketamine, nitrous oxide, propofol, desflurane, enflurane, halothane, isoflurane, and sevoflurane -medicines for depression, anxiety, or psychotic disturbances -medicines for sleep -muscle relaxants -naltrexone -narcotic medicines (opiates) for pain -phenothiazines like perphenazine, thioridazine, chlorpromazine, mesoridazine, fluphenazine, prochlorperazine, promazine, and trifluoperazine -scopolamine -tramadol -trihexyphenidyl This list may not describe all possible interactions. Give your health care provider a list of all the medicines, herbs, non-prescription drugs, or dietary supplements you use. Also tell them if you smoke, drink alcohol, or use illegal drugs. Some items may interact with your medicine. What should I watch for while using this medicine? Tell your doctor or health care professional if your pain does not go away, if it gets worse, or if you have new or a different type of pain. You may develop tolerance to the medicine. Tolerance means that you will need a higher dose of the medication for pain relief. Tolerance is normal and is expected if you take this medicine for a long time. Do not suddenly stop taking your medicine because you may develop a severe reaction. Your body becomes used to the medicine. This does NOT mean you are addicted. Addiction is a behavior related to getting and using a drug for a non-medical reason. If you have pain, you have a medical reason to take pain medicine. Your doctor will tell you how much medicine to take. If your doctor wants you to stop the medicine, the dose will be slowly lowered over time to avoid any side effects. You may get drowsy or dizzy. Do not  drive, use machinery, or do anything that needs mental alertness until you know how this medicine affects you. Do not stand or sit up quickly, especially if you are an older patient. This reduces the risk of dizzy or fainting spells. Alcohol may interfere with the effect of this medicine. Avoid alcoholic drinks. There are different types of narcotic medicines (opiates) for pain. If you take more than one type at the same time, you may have more side effects. Give your health care provider a list of all medicines you use. Your doctor will tell you how much medicine to take. Do not take more medicine than directed. Call emergency for help if you have problems breathing. The medicine will cause constipation. Try to have a bowel movement at least every 2 to 3 days. If you do not have a bowel movement for 3 days, call your doctor or health care professional. Do not take Tylenol (acetaminophen) or medicines that have acetaminophen with this medicine. Too much acetaminophen can be very dangerous. Many nonprescription medicines contain acetaminophen. Always read the labels carefully to avoid taking more acetaminophen. What side effects may I notice from receiving this medicine? Side effects that you should report to your doctor or health care professional as soon as possible: -allergic reactions like skin rash, itching or hives, swelling of the face, lips, or tongue -breathing difficulties, wheezing -confusion -light headedness or fainting spells -severe stomach pain -unusually weak or tired -yellowing of the skin or the whites of the eyes Side effects that usually do not require medical attention (report to your doctor or health care professional if they continue or are bothersome): -dizziness -drowsiness -nausea -vomiting This list may  not describe all possible side effects. Call your doctor for medical advice about side effects. You may report side effects to FDA at 1-800-FDA-1088. Where should I keep  my medicine? Keep out of the reach of children. This medicine can be abused. Keep your medicine in a safe place to protect it from theft. Do not share this medicine with anyone. Selling or giving away this medicine is dangerous and against the law. Store at room temperature between 20 and 25 degrees C (68 and 77 degrees F). Keep container tightly closed. Protect from light. This medicine may cause accidental overdose and death if it is taken by other adults, children, or pets. Flush any unused medicine down the toilet to reduce the chance of harm. Do not use the medicine after the expiration date. NOTE: This sheet is a summary. It may not cover all possible information. If you have questions about this medicine, talk to your doctor, pharmacist, or health care provider.  2015, Elsevier/Gold Standard. (2012-11-14 13:17:35)

## 2014-01-27 NOTE — ED Notes (Signed)
Pt c/o SOB x2 days, went to UC yesterday dx with muscle pain and given pain meds only, pt c/o pain to center of chest radiating to back when taking a deep breath, no distress noted; pt speaking in complete sentences

## 2014-02-05 ENCOUNTER — Encounter (HOSPITAL_BASED_OUTPATIENT_CLINIC_OR_DEPARTMENT_OTHER): Payer: Self-pay | Admitting: Emergency Medicine

## 2015-04-25 LAB — OB RESULTS CONSOLE RUBELLA ANTIBODY, IGM: RUBELLA: IMMUNE

## 2015-04-25 LAB — OB RESULTS CONSOLE HEPATITIS B SURFACE ANTIGEN: HEP B S AG: NEGATIVE

## 2015-04-25 LAB — OB RESULTS CONSOLE ANTIBODY SCREEN: Antibody Screen: NEGATIVE

## 2015-04-25 LAB — OB RESULTS CONSOLE ABO/RH: RH Type: POSITIVE

## 2015-04-25 LAB — OB RESULTS CONSOLE GC/CHLAMYDIA
Chlamydia: NEGATIVE
GC PROBE AMP, GENITAL: NEGATIVE

## 2015-04-25 LAB — OB RESULTS CONSOLE HIV ANTIBODY (ROUTINE TESTING): HIV: NONREACTIVE

## 2015-04-25 LAB — OB RESULTS CONSOLE RPR: RPR: NONREACTIVE

## 2015-07-30 LAB — OB RESULTS CONSOLE GBS: GBS: NEGATIVE

## 2015-08-23 ENCOUNTER — Other Ambulatory Visit: Payer: Self-pay | Admitting: Obstetrics & Gynecology

## 2015-08-23 ENCOUNTER — Encounter (HOSPITAL_COMMUNITY): Payer: Self-pay

## 2015-08-23 DIAGNOSIS — Z8742 Personal history of other diseases of the female genital tract: Secondary | ICD-10-CM

## 2015-08-23 DIAGNOSIS — Z6841 Body Mass Index (BMI) 40.0 and over, adult: Secondary | ICD-10-CM

## 2015-08-23 DIAGNOSIS — O321XX0 Maternal care for breech presentation, not applicable or unspecified: Secondary | ICD-10-CM | POA: Diagnosis present

## 2015-08-23 DIAGNOSIS — Z91040 Latex allergy status: Secondary | ICD-10-CM | POA: Diagnosis present

## 2015-08-23 DIAGNOSIS — O09529 Supervision of elderly multigravida, unspecified trimester: Secondary | ICD-10-CM

## 2015-08-23 NOTE — H&P (Addendum)
Janet Jacobs is a 35 y.o. female, [id number] presenting on 08/27/15 at [redacted] weeks gestation for scheduled primary C/S due to persistent breech presentation.  Last [place] on 5/18 showed continued breech--patient had been hoping for spontaneous version, since both previous children turned from breech to vtx in the last week of pregnancy.  Patient declined external version, and was agreeable to scheduling a C/S at 40 weeks.  Declines sterilization, plans IUD.  Patient Active Problem List   Diagnosis Date Noted  . Latex allergy 08/23/2015  . Breech presentation 08/23/2015  . BMI 45.0-49.9, adult ([place]) 08/23/2015  . AMA (advanced maternal age) multigravida 35+ 08/23/2015  . History of termination of pregnancy x 2 08/23/2015    History of present pregnancy: Patient entered care at 22 2/7 weeks for NOB interview, 25 weeks for NOB visit.   EDC of 08/27/15 was established by LMP   Anatomy scan:  25 weeks, with limited anatomy findings, transverse lie, and an fundal placenta.  EFW 1+10, 50%ile, cervix 4.5 cm. Additional [place] evaluations:   28 weeks:  EFW 2+11, 57%ile, transverse, normal fluid, still limited views of 5th digits and 4CH view, posterior placenta. 31 weeks:  EFW 3+12, 36%ile, AFI 17.42, frank breech, cervix closed.  Still limited views as previous US. 36 weeks:  AFI 11.19, BPP 8/8, breech. 37 2/7 weeks:  AFI 15.47, breech, anterior placenta, BPP 8/8 38 2/7 weeks:  AFI 10.73, breech, anterior placenta, BPP 8/8 30 2/7 weeks:  EFW 7=11, 49.6%ile, complete breech, AFI 16.85, anterior placenta, BPP 8/8 Significant prenatal events:  Entered care at 22 weeks for NOB interview, late to care due to insurance issues.  Declined genetic testing.  Hx previous waterbirth x 2, desired again.  Had facial rash, treated with hydrocortisone.  Fetus was persistently breech, with last SU on 08/22/15.  Patient elected to proceed with scheduled C/S at 40 weeks, still hoping baby would turn spontaneously.  Had weekly BPPs from 36  weeks.  GBS negative. Last evaluation:  08/22/15--cervix closed, thick, breech, -3, BP 112/65, weight 281.5.  OB History    Gravida Para Term Preterm AB TAB SAB Ectopic Multiple Living   5 2 2  0 2 2 0 0 0 2    1998--TAB at 8 weeks 2011--TAB at 8 weeks 2013--SVB, 41 weeks, waterbirth, female, 7+13, delivered with [facility], 4 hour labor 2014--SVB, 40 3/7 weeks, waterbirth, female, 7+15, delivered with [facility]  Past Medical History  Diagnosis Date  . No pertinent past medical history   . Medical history non-contributory    Past Surgical History  Procedure Laterality Date  . Wisdom tooth extraction    . No past surgeries     Family History: family history includes Arthritis in her father; Diabetes in her father; Hypertension in her father and mother; Miscarriages / [place] in her mother. There is no history of Alcohol abuse, Asthma, Birth defects, Cancer, COPD, Depression, Drug abuse, Early death, Hearing loss, Heart disease, Hyperlipidemia, Kidney disease, Learning disabilities, Mental illness, Mental retardation, Stroke, or Vision loss.   Social History:  reports that she has never smoked. She has never used smokeless tobacco. She reports that she does not drink alcohol or use illicit drugs.  Patient is biracial, born in [place], married to [place], who is involved and supportive.  She is post-graduated educated, employed as professor.   Prenatal Transfer Tool  Maternal Diabetes: No Genetic Screening: Declined Maternal Ultrasounds/Referrals: Normal, except for persistent breech presentation Fetal Ultrasounds or other Referrals:  None Maternal Substance Abuse:  No Significant Maternal Medications:  None Significant Maternal Lab Results: Lab values include: Group B Strep negative  TDAP 2/281/7 Flu 06/04/15  ROS:  Mild contractions, +FM  Allergies  Allergen Reactions  . Latex Hives, Itching and Rash       Last menstrual period 11/20/2014.  Chest clear Heart RRR without  murmur Abd gravid, NT, FH 39 cm Pelvic: Deferred Ext: WNL  FHR: 150 in office UCs:  None  Prenatal labs: ABO, Rh: A/Positive/-- (01/19 0000) Antibody: Negative (01/19 0000) Rubella:  Immune RPR: Nonreactive (01/19 0000)  HBsAg: Negative (01/19 0000)  HIV: Non-reactive (01/19 0000)  GBS: Negative (04/25 0000) Sickle cell/Hgb electrophoresis:  AA Pap:  WNL 05/2015 GC:  Negative 04/25/15 Chlamydia:  Negative 04/25/15 Genetic screenings:  Declined Glucola:  WNL Other:   Hgb 11.4 at NOB, 11.1 at 28 weeks       Assessment/Plan: IUP at 40 weeks Persistent breech presentation GBS negative BMI 48 AMA Latex allergy  Plan: Admit to [facility] per consult with Dr. Alesia Richards for scheduled primary C/S. Will verify presentation prior to surgery. Routine [facility] pre-op orders   Allena Katz, [place] 08/23/2015, 11:33 PM    CBC    Component Value Date/Time   WBC 14.1* 08/26/2015 1005   RBC 4.26 08/26/2015 1005   HGB 11.4* 08/26/2015 1005   HCT 34.8* 08/26/2015 1005   PLT 229 08/26/2015 1005   MCV 81.7 08/26/2015 1005   MCH 26.8 08/26/2015 1005   MCHC 32.8 08/26/2015 1005   RDW 15.0 08/26/2015 1005   LYMPHSABS 3.9 05/02/2011 0550   MONOABS 0.9 05/02/2011 0550   EOSABS 0.5 05/02/2011 0550   BASOSABS 0.0 05/02/2011 0550   Nonreactive (01/19 0000)   Ultrasound at office 08/22/15: EFW 7lbs 11 oz, complete breech, AFI 16, anterior placenta   Dr. Alesia Richards.

## 2015-08-26 ENCOUNTER — Encounter (HOSPITAL_COMMUNITY): Payer: Self-pay | Admitting: Anesthesiology

## 2015-08-26 ENCOUNTER — Encounter (HOSPITAL_COMMUNITY)
Admission: RE | Admit: 2015-08-26 | Discharge: 2015-08-26 | Disposition: A | Discharge status: Home / Self Care | Payer: Medicaid Other | Source: Ambulatory Visit | Attending: Obstetrics & Gynecology | Admitting: Obstetrics & Gynecology

## 2015-08-26 ENCOUNTER — Encounter (HOSPITAL_COMMUNITY): Payer: Self-pay

## 2015-08-26 HISTORY — DX: Adverse effect of unspecified anesthetic, initial encounter: T41.45XA

## 2015-08-26 HISTORY — DX: Other complications of anesthesia, initial encounter: T88.59XA

## 2015-08-26 LAB — TYPE AND SCREEN
ABO/RH(D): A POS
Antibody Screen: NEGATIVE

## 2015-08-26 LAB — CBC
HEMATOCRIT: 34.8 % — AB (ref 36.0–46.0)
HEMOGLOBIN: 11.4 g/dL — AB (ref 12.0–15.0)
MCH: 26.8 pg (ref 26.0–34.0)
MCHC: 32.8 g/dL (ref 30.0–36.0)
MCV: 81.7 fL (ref 78.0–100.0)
Platelets: 229 10*3/uL (ref 150–400)
RBC: 4.26 MIL/uL (ref 3.87–5.11)
RDW: 15 % (ref 11.5–15.5)
WBC: 14.1 10*3/uL — ABNORMAL HIGH (ref 4.0–10.5)

## 2015-08-26 MED ORDER — CEFAZOLIN SODIUM 10 G IJ SOLR
3.0000 g | INTRAMUSCULAR | Status: AC
  Administered 2015-08-27: 3 g via INTRAVENOUS
  Filled 2015-08-26: qty 3000

## 2015-08-26 NOTE — Anesthesia Preprocedure Evaluation (Addendum)
Anesthesia Evaluation  Patient identified by MRN, date of birth, ID band Patient awake    Reviewed: Allergy & Precautions, H&P , NPO status , Patient's Chart, lab work & pertinent test results  Airway Mallampati: II  TM Distance: >3 FB Neck ROM: full    Dental no notable dental hx.    Pulmonary neg pulmonary ROS,    Pulmonary exam normal       Cardiovascular negative cardio ROS Normal cardiovascular exam    Neuro/Psych negative neurological ROS  negative psych ROS   GI/Hepatic negative GI ROS, Neg liver ROS,   Endo/Other  Morbid obesity  Renal/GU negative Renal ROS     Musculoskeletal   Abdominal (+) + obese,   Peds  Hematology negative hematology ROS (+)   Anesthesia Other Findings   Reproductive/Obstetrics (+) Pregnancy                             Anesthesia Physical Anesthesia Plan  ASA: III  Anesthesia Plan: Spinal   Post-op Pain Management:    Induction:   Airway Management Planned:   Additional Equipment:   Intra-op Plan:   Post-operative Plan:   Informed Consent: I have reviewed the patients History and Physical, chart, labs and discussed the procedure including the risks, benefits and alternatives for the proposed anesthesia with the patient or authorized representative who has indicated his/her understanding and acceptance.     Plan Discussed with: CRNA and Surgeon  Anesthesia Plan Comments:         Anesthesia Quick Evaluation  

## 2015-08-26 NOTE — Patient Instructions (Signed)
[place]  08/26/2015   Your procedure is scheduled on:  08/27/2015  Enter through the Main Entrance of [facility] up the phone at the desk and dial [phone].   Call this number if you have problems the morning of surgery: [phone]   Remember:   Do not eat food:After Midnight.  Do not drink clear liquids: After Midnight.  Take these medicines the morning of surgery with A SIP OF WATER: none   Do not wear jewelry, make-up or nail polish.  Do not wear lotions, powders, or perfumes. You may wear deodorant.  Do not shave 48 hours prior to surgery.  Do not bring valuables to the hospital.  [facility] is not   responsible for any belongings or valuables brought to the hospital.  Contacts, dentures or bridgework may not be worn into surgery.  Leave suitcase in the car. After surgery it may be brought to your room.  For patients admitted to the hospital, checkout time is 11:00 AM the day of              discharge.   Patients discharged the day of surgery will not be allowed to drive             home.  Name and phone number of your driver: na  Special Instructions:   Shower using CHG 2 nights before surgery and the night before surgery.  If you shower the day of surgery use CHG.  Use special wash - you have one bottle of CHG for all showers.  You should use approximately 1/3 of the bottle for each shower.   Please read over the following fact sheets that you were given:   Surgical Site Infection Prevention

## 2015-08-27 ENCOUNTER — Inpatient Hospital Stay (HOSPITAL_COMMUNITY): Payer: Medicaid Other | Admitting: Anesthesiology

## 2015-08-27 ENCOUNTER — Encounter (HOSPITAL_COMMUNITY): Payer: Self-pay | Admitting: Certified Registered Nurse Anesthetist

## 2015-08-27 ENCOUNTER — Inpatient Hospital Stay (HOSPITAL_COMMUNITY)
Admission: RE | Admit: 2015-08-27 | Discharge: 2015-08-30 | DRG: 765 | Disposition: A | Discharge status: Home / Self Care | Payer: Medicaid Other | Source: Ambulatory Visit | Attending: Obstetrics & Gynecology | Admitting: Obstetrics & Gynecology

## 2015-08-27 ENCOUNTER — Encounter (HOSPITAL_COMMUNITY)
Admission: RE | Disposition: A | Discharge status: Home / Self Care | Payer: Self-pay | Source: Ambulatory Visit | Attending: Obstetrics & Gynecology

## 2015-08-27 DIAGNOSIS — O9902 Anemia complicating childbirth: Secondary | ICD-10-CM | POA: Diagnosis present

## 2015-08-27 DIAGNOSIS — Z8249 Family history of ischemic heart disease and other diseases of the circulatory system: Secondary | ICD-10-CM | POA: Diagnosis not present

## 2015-08-27 DIAGNOSIS — O09529 Supervision of elderly multigravida, unspecified trimester: Secondary | ICD-10-CM

## 2015-08-27 DIAGNOSIS — Z8261 Family history of arthritis: Secondary | ICD-10-CM

## 2015-08-27 DIAGNOSIS — O321XX0 Maternal care for breech presentation, not applicable or unspecified: Secondary | ICD-10-CM | POA: Diagnosis present

## 2015-08-27 DIAGNOSIS — O328XX0 Maternal care for other malpresentation of fetus, not applicable or unspecified: Principal | ICD-10-CM | POA: Diagnosis present

## 2015-08-27 DIAGNOSIS — Z6841 Body Mass Index (BMI) 40.0 and over, adult: Secondary | ICD-10-CM

## 2015-08-27 DIAGNOSIS — Z91040 Latex allergy status: Secondary | ICD-10-CM

## 2015-08-27 DIAGNOSIS — Z3A40 40 weeks gestation of pregnancy: Secondary | ICD-10-CM | POA: Diagnosis not present

## 2015-08-27 DIAGNOSIS — D649 Anemia, unspecified: Secondary | ICD-10-CM | POA: Diagnosis present

## 2015-08-27 DIAGNOSIS — Z8742 Personal history of other diseases of the female genital tract: Secondary | ICD-10-CM

## 2015-08-27 DIAGNOSIS — O99214 Obesity complicating childbirth: Secondary | ICD-10-CM | POA: Diagnosis present

## 2015-08-27 DIAGNOSIS — Z833 Family history of diabetes mellitus: Secondary | ICD-10-CM

## 2015-08-27 DIAGNOSIS — Z98891 History of uterine scar from previous surgery: Secondary | ICD-10-CM

## 2015-08-27 LAB — RPR: RPR Ser Ql: NONREACTIVE

## 2015-08-27 SURGERY — Surgical Case
Anesthesia: Spinal

## 2015-08-27 MED ORDER — NALOXONE HCL 0.4 MG/ML IJ SOLN: 0.4000 mg | INTRAMUSCULAR | Status: DC | PRN

## 2015-08-27 MED ORDER — MORPHINE SULFATE (PF) 0.5 MG/ML IJ SOLN
INTRAMUSCULAR | Status: AC
  Filled 2015-08-27: qty 10

## 2015-08-27 MED ORDER — DIPHENHYDRAMINE HCL 12.5 MG/5ML PO ELIX
12.5000 mg | ORAL_SOLUTION | Freq: Four times a day (QID) | ORAL | Status: DC | PRN
  Filled 2015-08-27: qty 5

## 2015-08-27 MED ORDER — FENTANYL CITRATE (PF) 100 MCG/2ML IJ SOLN
INTRAMUSCULAR | Status: AC
  Filled 2015-08-27: qty 2

## 2015-08-27 MED ORDER — NALBUPHINE HCL 10 MG/ML IJ SOLN
5.0000 mg | Freq: Once | INTRAMUSCULAR | Status: DC | PRN
Start: 2015-08-27 — End: 2015-08-30

## 2015-08-27 MED ORDER — SENNOSIDES-DOCUSATE SODIUM 8.6-50 MG PO TABS
2.0000 | ORAL_TABLET | ORAL | Status: DC
  Administered 2015-08-27 – 2015-08-30 (×3): 2 via ORAL
  Filled 2015-08-27 (×3): qty 2

## 2015-08-27 MED ORDER — MEPERIDINE HCL 25 MG/ML IJ SOLN: 6.2500 mg | INTRAMUSCULAR | Status: DC | PRN

## 2015-08-27 MED ORDER — DIBUCAINE 1 % RE OINT: 1.0000 "application " | TOPICAL_OINTMENT | RECTAL | Status: DC | PRN

## 2015-08-27 MED ORDER — ACETAMINOPHEN 325 MG PO TABS: 650.0000 mg | ORAL_TABLET | ORAL | Status: DC | PRN

## 2015-08-27 MED ORDER — HYDROMORPHONE HCL 1 MG/ML IJ SOLN: 0.2500 mg | INTRAMUSCULAR | Status: DC | PRN

## 2015-08-27 MED ORDER — SCOPOLAMINE 1 MG/3DAYS TD PT72
1.0000 | MEDICATED_PATCH | Freq: Once | TRANSDERMAL | Status: AC
  Administered 2015-08-27: 1.5 mg via TRANSDERMAL

## 2015-08-27 MED ORDER — SCOPOLAMINE 1 MG/3DAYS TD PT72: 1.0000 | MEDICATED_PATCH | Freq: Once | TRANSDERMAL | Status: DC

## 2015-08-27 MED ORDER — MEPERIDINE HCL 25 MG/ML IJ SOLN
INTRAMUSCULAR | Status: DC | PRN
  Administered 2015-08-27 (×2): 12.5 mg via INTRAVENOUS

## 2015-08-27 MED ORDER — MORPHINE SULFATE (PF) 0.5 MG/ML IJ SOLN
INTRAMUSCULAR | Status: DC | PRN
  Administered 2015-08-27: .2 mg via INTRATHECAL

## 2015-08-27 MED ORDER — HYDROMORPHONE HCL 1 MG/ML IJ SOLN
1.0000 mg | INTRAMUSCULAR | Status: AC
  Administered 2015-08-27: 1 mg via INTRAVENOUS
  Filled 2015-08-27 (×2): qty 1

## 2015-08-27 MED ORDER — PHENYLEPHRINE 8 MG IN D5W 100 ML (0.08MG/ML) PREMIX OPTIME
INJECTION | INTRAVENOUS | Status: AC
  Filled 2015-08-27: qty 100

## 2015-08-27 MED ORDER — LACTATED RINGERS IV SOLN: INTRAVENOUS | Status: DC

## 2015-08-27 MED ORDER — IBUPROFEN 600 MG PO TABS
600.0000 mg | ORAL_TABLET | Freq: Four times a day (QID) | ORAL | Status: DC
  Administered 2015-08-27 – 2015-08-30 (×9): 600 mg via ORAL
  Filled 2015-08-27 (×12): qty 1

## 2015-08-27 MED ORDER — SIMETHICONE 80 MG PO CHEW
80.0000 mg | CHEWABLE_TABLET | ORAL | Status: DC | PRN
  Administered 2015-08-28 – 2015-08-29 (×4): 80 mg via ORAL
  Filled 2015-08-27 (×4): qty 1

## 2015-08-27 MED ORDER — KETOROLAC TROMETHAMINE 30 MG/ML IJ SOLN
INTRAMUSCULAR | Status: AC
  Administered 2015-08-27: 30 mg via INTRAMUSCULAR
  Filled 2015-08-27: qty 1

## 2015-08-27 MED ORDER — BUPIVACAINE IN DEXTROSE 0.75-8.25 % IT SOLN
INTRATHECAL | Status: DC | PRN
  Administered 2015-08-27: 1.5 mL via INTRATHECAL

## 2015-08-27 MED ORDER — HYDROMORPHONE 1 MG/ML IV SOLN
INTRAVENOUS | Status: DC
  Administered 2015-08-27: 14:00:00 via INTRAVENOUS
  Administered 2015-08-27: 5 mL via INTRAVENOUS
  Administered 2015-08-28: 16.9 mL via INTRAVENOUS
  Filled 2015-08-27 (×2): qty 25

## 2015-08-27 MED ORDER — ONDANSETRON HCL 4 MG/2ML IJ SOLN
INTRAMUSCULAR | Status: DC | PRN
  Administered 2015-08-27: 4 mg via INTRAVENOUS

## 2015-08-27 MED ORDER — WITCH HAZEL-GLYCERIN EX PADS: 1.0000 "application " | MEDICATED_PAD | CUTANEOUS | Status: DC | PRN

## 2015-08-27 MED ORDER — ZOLPIDEM TARTRATE 5 MG PO TABS: 5.0000 mg | ORAL_TABLET | Freq: Every evening | ORAL | Status: DC | PRN

## 2015-08-27 MED ORDER — DIPHENHYDRAMINE HCL 25 MG PO CAPS
25.0000 mg | ORAL_CAPSULE | ORAL | Status: DC | PRN
  Filled 2015-08-27: qty 1

## 2015-08-27 MED ORDER — IBUPROFEN 600 MG PO TABS
600.0000 mg | ORAL_TABLET | Freq: Four times a day (QID) | ORAL | Status: DC | PRN
  Administered 2015-08-28 – 2015-08-30 (×3): 600 mg via ORAL

## 2015-08-27 MED ORDER — ONDANSETRON HCL 4 MG/2ML IJ SOLN: 4.0000 mg | Freq: Once | INTRAMUSCULAR | Status: DC | PRN

## 2015-08-27 MED ORDER — NALBUPHINE HCL 10 MG/ML IJ SOLN: 5.0000 mg | INTRAMUSCULAR | Status: DC | PRN

## 2015-08-27 MED ORDER — MAGNESIUM HYDROXIDE 400 MG/5ML PO SUSP: 30.0000 mL | ORAL | Status: DC | PRN

## 2015-08-27 MED ORDER — MEPERIDINE HCL 25 MG/ML IJ SOLN
INTRAMUSCULAR | Status: AC
  Filled 2015-08-27: qty 1

## 2015-08-27 MED ORDER — LACTATED RINGERS IV SOLN
INTRAVENOUS | Status: DC
Start: 2015-08-27 — End: 2015-08-27
  Administered 2015-08-27: 08:00:00 via INTRAVENOUS

## 2015-08-27 MED ORDER — SODIUM CHLORIDE 0.9% FLUSH: 9.0000 mL | INTRAVENOUS | Status: DC | PRN

## 2015-08-27 MED ORDER — LACTATED RINGERS IV SOLN
Freq: Once | INTRAVENOUS | Status: AC
  Administered 2015-08-27: 07:00:00 via INTRAVENOUS

## 2015-08-27 MED ORDER — OXYTOCIN 10 UNIT/ML IJ SOLN
INTRAMUSCULAR | Status: AC
  Filled 2015-08-27: qty 4

## 2015-08-27 MED ORDER — SCOPOLAMINE 1 MG/3DAYS TD PT72
MEDICATED_PATCH | TRANSDERMAL | Status: AC
  Administered 2015-08-27: 1.5 mg via TRANSDERMAL
  Filled 2015-08-27: qty 1

## 2015-08-27 MED ORDER — NALOXONE HCL 2 MG/2ML IJ SOSY: 1.0000 ug/kg/h | PREFILLED_SYRINGE | INTRAVENOUS | Status: DC | PRN

## 2015-08-27 MED ORDER — ONDANSETRON HCL 4 MG/2ML IJ SOLN
INTRAMUSCULAR | Status: AC
  Filled 2015-08-27: qty 2

## 2015-08-27 MED ORDER — PRENATAL MULTIVITAMIN CH
1.0000 | ORAL_TABLET | Freq: Every day | ORAL | Status: DC
  Administered 2015-08-28 – 2015-08-30 (×3): 1 via ORAL
  Filled 2015-08-27 (×3): qty 1

## 2015-08-27 MED ORDER — KETOROLAC TROMETHAMINE 30 MG/ML IJ SOLN: 30.0000 mg | Freq: Four times a day (QID) | INTRAMUSCULAR | Status: AC | PRN

## 2015-08-27 MED ORDER — COCONUT OIL OIL: 1.0000 "application " | TOPICAL_OIL | Status: DC | PRN

## 2015-08-27 MED ORDER — DIPHENHYDRAMINE HCL 50 MG/ML IJ SOLN: 12.5000 mg | Freq: Four times a day (QID) | INTRAMUSCULAR | Status: DC | PRN

## 2015-08-27 MED ORDER — LACTATED RINGERS IV SOLN
INTRAVENOUS | Status: DC | PRN
  Administered 2015-08-27 (×3): via INTRAVENOUS

## 2015-08-27 MED ORDER — MENTHOL 3 MG MT LOZG: 1.0000 | LOZENGE | OROMUCOSAL | Status: DC | PRN

## 2015-08-27 MED ORDER — KETOROLAC TROMETHAMINE 30 MG/ML IJ SOLN: 30.0000 mg | Freq: Once | INTRAMUSCULAR | Status: DC

## 2015-08-27 MED ORDER — OXYCODONE-ACETAMINOPHEN 5-325 MG PO TABS
2.0000 | ORAL_TABLET | ORAL | Status: DC | PRN
  Administered 2015-08-28 – 2015-08-29 (×4): 2 via ORAL
  Filled 2015-08-27 (×4): qty 2

## 2015-08-27 MED ORDER — OXYTOCIN 10 UNIT/ML IJ SOLN
40.0000 [IU] | INTRAVENOUS | Status: DC | PRN
  Administered 2015-08-27: 40 [IU] via INTRAVENOUS

## 2015-08-27 MED ORDER — PHENYLEPHRINE 8 MG IN D5W 100 ML (0.08MG/ML) PREMIX OPTIME
INJECTION | INTRAVENOUS | Status: DC | PRN
  Administered 2015-08-27: 60 ug/min via INTRAVENOUS

## 2015-08-27 MED ORDER — ONDANSETRON HCL 4 MG/2ML IJ SOLN: 4.0000 mg | Freq: Four times a day (QID) | INTRAMUSCULAR | Status: DC | PRN

## 2015-08-27 MED ORDER — FENTANYL CITRATE (PF) 100 MCG/2ML IJ SOLN
INTRAMUSCULAR | Status: DC | PRN
  Administered 2015-08-27: 25 ug via INTRAVENOUS
  Administered 2015-08-27: 10 ug via INTRAVENOUS
  Administered 2015-08-27: 20 ug via INTRAVENOUS
  Administered 2015-08-27: 20 ug via INTRATHECAL

## 2015-08-27 MED ORDER — ACETAMINOPHEN 500 MG PO TABS
1000.0000 mg | ORAL_TABLET | Freq: Four times a day (QID) | ORAL | Status: AC
  Administered 2015-08-27 – 2015-08-28 (×3): 1000 mg via ORAL
  Filled 2015-08-27 (×4): qty 2

## 2015-08-27 MED ORDER — ONDANSETRON HCL 4 MG/2ML IJ SOLN: 4.0000 mg | Freq: Three times a day (TID) | INTRAMUSCULAR | Status: DC | PRN

## 2015-08-27 MED ORDER — SODIUM CHLORIDE 0.9 % IR SOLN
Status: DC | PRN
  Administered 2015-08-27: 1

## 2015-08-27 MED ORDER — DIPHENHYDRAMINE HCL 50 MG/ML IJ SOLN: 12.5000 mg | INTRAMUSCULAR | Status: DC | PRN

## 2015-08-27 MED ORDER — SODIUM CHLORIDE 0.9% FLUSH: 3.0000 mL | INTRAVENOUS | Status: DC | PRN

## 2015-08-27 MED ORDER — PHENYLEPHRINE HCL 10 MG/ML IJ SOLN
INTRAMUSCULAR | Status: DC | PRN
  Administered 2015-08-27: 40 ug via INTRAVENOUS
  Administered 2015-08-27 (×2): 80 ug via INTRAVENOUS

## 2015-08-27 MED ORDER — OXYTOCIN 40 UNITS IN LACTATED RINGERS INFUSION - SIMPLE MED: 2.5000 [IU]/h | INTRAVENOUS | Status: AC

## 2015-08-27 MED ORDER — PHENYLEPHRINE 40 MCG/ML (10ML) SYRINGE FOR IV PUSH (FOR BLOOD PRESSURE SUPPORT)
PREFILLED_SYRINGE | INTRAVENOUS | Status: AC
  Filled 2015-08-27: qty 10

## 2015-08-27 MED ORDER — OXYCODONE-ACETAMINOPHEN 5-325 MG PO TABS
1.0000 | ORAL_TABLET | ORAL | Status: DC | PRN
  Administered 2015-08-29 – 2015-08-30 (×5): 1 via ORAL
  Filled 2015-08-27 (×5): qty 1

## 2015-08-27 MED ORDER — DIPHENHYDRAMINE HCL 25 MG PO CAPS: 25.0000 mg | ORAL_CAPSULE | Freq: Four times a day (QID) | ORAL | Status: DC | PRN

## 2015-08-27 SURGICAL SUPPLY — 33 items
CLAMP CORD UMBIL (MISCELLANEOUS) IMPLANT
CLOTH BEACON ORANGE TIMEOUT ST (SAFETY) ×2 IMPLANT
DRAPE C SECTION CLR SCREEN (DRAPES) ×2 IMPLANT
DRSG OPSITE POSTOP 4X10 (GAUZE/BANDAGES/DRESSINGS) ×2 IMPLANT
DURAPREP 26ML APPLICATOR (WOUND CARE) ×2 IMPLANT
ELECT REM PT RETURN 9FT ADLT (ELECTROSURGICAL) ×2
ELECTRODE REM PT RTRN 9FT ADLT (ELECTROSURGICAL) ×1 IMPLANT
EXTRACTOR VACUUM M CUP 4 TUBE (SUCTIONS) IMPLANT
GLOVE BIOGEL PI IND STRL 7.0 (GLOVE) ×2 IMPLANT
GLOVE BIOGEL PI INDICATOR 7.0 (GLOVE) ×2
GLOVE SURG SS PI 6.5 STRL IVOR (GLOVE) ×2 IMPLANT
GOWN STRL REUS W/TWL LRG LVL3 (GOWN DISPOSABLE) ×4 IMPLANT
KIT ABG SYR 3ML LUER SLIP (SYRINGE) IMPLANT
LIQUID BAND (GAUZE/BANDAGES/DRESSINGS) ×2 IMPLANT
NEEDLE HYPO 25X5/8 SAFETYGLIDE (NEEDLE) IMPLANT
NS IRRIG 1000ML POUR BTL (IV SOLUTION) ×2 IMPLANT
PACK C SECTION WH (CUSTOM PROCEDURE TRAY) ×2 IMPLANT
PAD OB MATERNITY 4.3X12.25 (PERSONAL CARE ITEMS) ×2 IMPLANT
PENCIL SMOKE EVAC W/HOLSTER (ELECTROSURGICAL) ×2 IMPLANT
RTRCTR C-SECT PINK 25CM LRG (MISCELLANEOUS) ×2 IMPLANT
SUT CHROMIC 1 CTX 36 (SUTURE) IMPLANT
SUT CHROMIC 2 0 CT 1 (SUTURE) ×2 IMPLANT
SUT MON AB 4-0 PS1 27 (SUTURE) ×2 IMPLANT
SUT PLAIN 1 NONE 54 (SUTURE) IMPLANT
SUT PLAIN 2 0 (SUTURE)
SUT PLAIN 2 0 XLH (SUTURE) ×4 IMPLANT
SUT PLAIN ABS 2-0 CT1 27XMFL (SUTURE) IMPLANT
SUT VIC AB 0 CTX 36 (SUTURE) ×1
SUT VIC AB 0 CTX36XBRD ANBCTRL (SUTURE) ×1 IMPLANT
SUT VIC AB 1 CTX 36 (SUTURE) ×4
SUT VIC AB 1 CTX36XBRD ANBCTRL (SUTURE) ×4 IMPLANT
TOWEL OR 17X24 6PK STRL BLUE (TOWEL DISPOSABLE) ×2 IMPLANT
TRAY FOLEY CATH SILVER 14FR (SET/KITS/TRAYS/PACK) ×2 IMPLANT

## 2015-08-27 NOTE — Op Note (Signed)
Patient: Janet Jacobs, Janet Jacobs DOB: March 27, 1981 MRN:  [id number]  DATE OF SURGERY:  08/27/2015  PREOP DIAGNOSIS:  1. 36 week 0 day  EGA intrauterine pregnancy 2. Breech presentation.   POSTOP DIAGNOSIS: Same as above.  PROCEDURE: Primary low uterine segment transverse cesarean section via Pfannenstiel incision.     SURGEON: Dr.  Johny Sax Alesia Richards  ASSISTANT: Maida Sale.  RNFA.   ANESTHESIA: Spinal  COMPLICATIONS: None  FINDINGS: Viable female infant in breech presentation, DSP, weight 8 pounds 4.3 ounces, Apgar scores of 9 and 9. Normal uterus and fallopian tubes and ovaries bilaterally.    EBL:  750cc  IV FLUID:  3500 cc LR   URINE OUTPUT: 400 cc clear urine  INDICATIONS: 35 y/o P2 who presented for a primary cesarean section due to breech presentation at [redacted] weeks EGA. Patient had been overt external cephalic version and she had declined.  She was consented for the procedure after explaining the risks benefits and alternatives of the procedure.    PROCEDURE:   Informed consent was obtained from the patient to undergo the procedure. She was taken to the operating room where her spinal anesthesia was found to be adequate. She was prepped and draped in the usual sterile fashion and a Foley catheter was placed. She received 2 g of IV Ancef preoperatively. A Pfannenstiel incision was made with the scalpel and the incision extended through the subcutaneous layer and also the fascia with the bovie. Small perforators in the subcutaneous layer were contained with the Bovie. The fascia was nicked in the midline and then was further separated from the rectus muscles bilaterally using Mayo scissors. Kochers were placed inferiorly and then superiorly to allow further separation of fascia from the rectus muscles.  The peritoneal cavity was entered bluntly with the fingers. The Alexis retractor was placed in. The bladder flap was created using Metzenbaum scissors.   The uterus was incised with a  scalpel and the incision extended bluntly bilaterally with fingers. Clear amniotic fluid was noted.  Double footling breech presenting fetus was noted. Fetus was grasped by the feet, feet delivered followed by the sacrum. Then the trunk was delivered. The fetus was then rotated so that the sacrum faced anterioly.   Then the right arm was flexed and delivered across the chest, then the left hand was also similarly flexed and delivered. Maxillary pressure was then applied on the fetal maxillary region to deliver the rest of the baby with head flexion.  She delivered a viable female infant.  The cord was clamped and cut. Cord blood was collected.    The uterus was not  exteriorized.  The edges of the uterus was grasped with Allis clamps and also T. Clamps.  The placenta was delivered with gentle traction on the umbilical cord. The uterus was cleared of clots and debris with a lap.  The incision uterine incision was closed with #1 Vicryl in a running locked stitch. An imbricating layer of same stitch was placed over the initial closure.  A small area that bled on the left side was contained with figure of 8 stitch.  Irrigation was applied and suctioned out. Excellent hemostasis was noted over the incision.  The muscles were then reapproximated using chromic suture.  Fascia was closed using 0 Vicryl in a running stitch. The subcutaneous layer was irrigated and suctioned out. Small perforators were contained with the bovie.  The subcutaneous layer was closed over using 1-0 plain in interrupted stitches. The skin was closed using 4-0  Monocryl. Dermabond was applied. Honeycomb was then applied. The patient was then cleaned and she was taken to the recovery room in stable condition. The neonate was also taken to the recovery room in stable condition.   SPECIMENS: Placenta to be taken home by mother part request.  Umbilical cord blood to the lab  DISPOSITION: TO PACU, STABLE.

## 2015-08-27 NOTE — Lactation Note (Signed)
This note was copied from a baby's chart. Lactation Consultation Note  Patient Name: Janet Jacobs [id number] Date: 08/27/2015 Reason for consult: Initial assessment Baby at 9 hr of life. Experienced bf mom reports feeding are going well. She denies breast or nipple pain, voiced no concerns. Discussed baby behavior, feeding frequency, baby belly size, voids, wt loss, breast changes, and nipple care. Demonstrated manual expression, colostrum noted bilaterally, spoon in room. Given lactation handouts. Aware of OP services and support group.     Maternal Data Has patient been taught Hand Expression?: Yes Does the patient have breastfeeding experience prior to this delivery?: Yes  Feeding Feeding Type: Breast Fed Length of feed: 30 min  LATCH Score/Interventions Latch: Grasps breast easily, tongue down, lips flanged, rhythmical sucking.  Audible Swallowing: A few with stimulation Intervention(s): Skin to skin;Hand expression  Type of Nipple: Everted at rest and after stimulation  Comfort (Breast/Nipple): Soft / non-tender     Hold (Positioning): No assistance needed to correctly position infant at breast.  LATCH Score: 9  Lactation Tools Discussed/Used WIC Program: No   Consult Status Consult Status: Follow-up Date: 08/28/15 Follow-up type: In-patient    Denzil Hughes 08/27/2015, 6:12 PM

## 2015-08-27 NOTE — Interval H&P Note (Signed)
History and Physical Interval Note:  08/27/2015 7:26 AM  Janet Jacobs  has presented today for surgery, with the diagnosis of Breech Presentation  The various methods of treatment have been discussed with the patient and family. After consideration of risks, benefits and other options for treatment, the patient has consented to  Procedure(s): CESAREAN SECTION (N/A) as a surgical intervention .  The patient's history has been reviewed, patient examined, no change in status, stable for surgery.  I have reviewed the patient's chart and labs.  Questions were answered to the patient's satisfaction.     [facility]

## 2015-08-27 NOTE — Transfer of Care (Signed)
Immediate Anesthesia Transfer of Care Note  Patient: Janet Jacobs  Procedure(s) Performed: Procedure(s): CESAREAN SECTION (N/A)  Patient Location: PACU  Anesthesia Type:Spinal  Level of Consciousness: awake  Airway & Oxygen Therapy: Patient Spontanous Breathing  Post-op Assessment: Report given to RN  Post vital signs: Reviewed and stable  Last Vitals:  Filed Vitals:   08/27/15 0604  BP: 132/88  Pulse: 98  Temp: 36.3 C  Resp: 20    Last Pain: There were no vitals filed for this visit.    Patients Stated Pain Goal: 4 ([id number])  Complications: No apparent anesthesia complications

## 2015-08-27 NOTE — Anesthesia Procedure Notes (Signed)
Spinal Patient location during procedure: OR Start time: 08/27/2015 7:35 AM End time: 08/27/2015 7:40 AM Staffing Anesthesiologist: Lyn Hollingshead Performed by: anesthesiologist  Preanesthetic Checklist Completed: patient identified, surgical consent, pre-op evaluation, timeout performed, IV checked, risks and benefits discussed and monitors and equipment checked Spinal Block Patient position: sitting Prep: site prepped and draped and DuraPrep Patient monitoring: heart rate, cardiac monitor, continuous pulse ox and blood pressure Approach: midline Location: L3-4 Injection technique: single-shot Needle Needle type: Sprotte  Needle gauge: 24 G Needle length: 9 cm Needle insertion depth: 9 cm Assessment Sensory level: T3

## 2015-08-27 NOTE — Brief Op Note (Signed)
08/27/2015  9:15 AM  PATIENT:  Janet Jacobs  35 y.o. female  PRE-OPERATIVE DIAGNOSIS:  Breech Presentation at [address]  POST-OPERATIVE DIAGNOSIS:  Breech Presentation at [address]  PROCEDURE:  Procedure(s): CESAREAN SECTION (N/A)  SURGEON:  Surgeon(s) and Role:    * Waymon Amato, MD - Primary  ASSISTANTS: TUCKER, TRACEY O, RNFA   ANESTHESIA:   spinal  EBL:  Total I/O In: 3500 [I.V.:3500] Out: 1150 [Urine:400; Blood:750]  BLOOD ADMINISTERED:none  DRAINS: none   LOCAL MEDICATIONS USED:  NONE  SPECIMEN:  Cord blood to lab. Placenta to be taken home by mother per request.   COUNTS:  YES  TOURNIQUET:  * No tourniquets in log *  DICTATION: .Dragon Dictation  PLAN OF CARE: Admit to inpatient   PATIENT DISPOSITION:  PACU - hemodynamically stable.   Delay start of Pharmacological VTE agent (>24hrs) due to surgical blood loss or risk of bleeding: not applicable

## 2015-08-27 NOTE — Progress Notes (Signed)
Pt declined to get up for orthostatics.  PCA running, working on pain management.

## 2015-08-27 NOTE — Anesthesia Postprocedure Evaluation (Signed)
Anesthesia Post Note  Patient: Janet Jacobs  Procedure(s) Performed: Procedure(s) (LRB): CESAREAN SECTION (N/A)  Patient location during evaluation: PACU Anesthesia Type: Spinal Level of consciousness: awake Pain management: pain level controlled Vital Signs Assessment: post-procedure vital signs reviewed and stable Respiratory status: spontaneous breathing Cardiovascular status: stable Postop Assessment: no headache, no backache, spinal receding, patient able to bend at knees and no signs of nausea or vomiting Anesthetic complications: no     Last Vitals:  Filed Vitals:   08/27/15 1015 08/27/15 1038  BP: 135/74 95/68  Pulse: 76 72  Temp:  36.8 C  Resp: 17 18    Last Pain:  Filed Vitals:   08/27/15 1038  PainSc: 6    Pain Goal: Patients Stated Pain Goal: 4 (08/27/15 0604)               [place]

## 2015-08-28 LAB — CBC
HCT: 23.6 % — ABNORMAL LOW (ref 36.0–46.0)
Hemoglobin: 7.7 g/dL — ABNORMAL LOW (ref 12.0–15.0)
MCH: 26.9 pg (ref 26.0–34.0)
MCHC: 32.6 g/dL (ref 30.0–36.0)
MCV: 82.5 fL (ref 78.0–100.0)
PLATELETS: 216 10*3/uL (ref 150–400)
RBC: 2.86 MIL/uL — ABNORMAL LOW (ref 3.87–5.11)
RDW: 15.2 % (ref 11.5–15.5)
WBC: 15.2 10*3/uL — AB (ref 4.0–10.5)

## 2015-08-28 MED ORDER — LACTATED RINGERS IV BOLUS (SEPSIS)
300.0000 mL | Freq: Once | INTRAVENOUS | Status: AC
  Administered 2015-08-28: 300 mL via INTRAVENOUS

## 2015-08-28 MED ORDER — FERROUS SULFATE 325 (65 FE) MG PO TABS
325.0000 mg | ORAL_TABLET | Freq: Every day | ORAL | Status: DC
  Administered 2015-08-28 – 2015-08-30 (×3): 325 mg via ORAL
  Filled 2015-08-28 (×3): qty 1

## 2015-08-28 NOTE — Lactation Note (Signed)
This note was copied from a baby's chart. Lactation Consultation Note  Patient Name: Janet Jacobs [id number] Date: 08/28/2015 Reason for consult: Follow-up assessment   Follow up with exp BF mom of 69 hour old infant. Infant with 7 BF for 20-30 minutes, 3 voids and 5 stools in last 24 hours prior to this assessment. Infant 8 lb 1.5 oz with 2% weight loss since birth. LATCH Scores 9 by bedside RN.  Infant is laying in mom's arms vigorously sucking on pacifier mom brought from home. Mom reports some nipple tenderness with latch, she was applying a "nipple Cream" she brought from home. Enc her to use EBM post feeding. When asked about pacifier, mom reports she is using it a lot. Enc her to allow infant to meet all suckling needs at the breast to encourage milk to come in, mom immediately took pacifier and latched infant to breast. Mom is able to easily hand express gtts of milk.   Mom latched infant to left breast in cradle hold, reviewed NB positioning to assist with deepening latch. Mom did as I suggested, Infant was fretful and on and off the breast, mom was massaging breast with feeding. Suggested mom burp infant and try to BF again.  Enc mom to call with questions/concerns prn.   Maternal Data Formula Feeding for Exclusion: No Has patient been taught Hand Expression?: Yes Does the patient have breastfeeding experience prior to this delivery?: Yes  Feeding Feeding Type: Breast Fed Length of feed: 20 min  LATCH Score/Interventions Latch: Repeated attempts needed to sustain latch, nipple held in mouth throughout feeding, stimulation needed to elicit sucking reflex. Intervention(s): Adjust position;Assist with latch;Breast massage;Breast compression  Audible Swallowing: A few with stimulation Intervention(s): Hand expression;Alternate breast massage  Type of Nipple: Everted at rest and after stimulation  Comfort (Breast/Nipple): Filling, red/small blisters or bruises, mild/mod  discomfort  Problem noted: Mild/Moderate discomfort (with latch) Interventions (Mild/moderate discomfort): Hand expression (EBM to nipples post BF)  Hold (Positioning): Assistance needed to correctly position infant at breast and maintain latch.  LATCH Score: 6  Lactation Tools Discussed/Used     Consult Status Consult Status: Follow-up Date: 08/29/15 Follow-up type: In-patient    [name] 08/28/2015, 3:01 PM

## 2015-08-28 NOTE — Progress Notes (Signed)
[facility] [employer] nurse Verdis Frederickson notified me of positive Hep B result. M. [facility] [place] [facility] notified of the positive Hep B result. Still waiting for official faxed results. [facility] [employer] RN called and left voice mail.

## 2015-08-28 NOTE — Progress Notes (Addendum)
Subjective: Postpartum Day 1: Cesarean Delivery Patient reports tolerating PO.  PT still with PCA and foley.  Objective: Vital signs in last 24 hours: Temp:  [97.7 F (36.5 C)-98 F (36.7 C)] 97.8 F (36.6 C) (05/24 0600) Pulse Rate:  [73-101] 101 (05/24 0600) Resp:  [14-26] 20 (05/24 0557) BP: (94-119)/(52-66) 119/65 mmHg (05/24 0600) SpO2:  [95 %-100 %] 97 % (05/24 0557)  Physical Exam:  General: alert and no distress Lochia: appropriate Uterine Fundus: firm Incision: dressing c/d/i DVT Evaluation: No evidence of DVT seen on physical exam.   Recent Labs  08/26/15 1005 08/28/15 0501  HGB 11.4* 7.7*  HCT 34.8* 23.6*    Assessment/Plan: Status post Cesarean section. Doing well postoperatively.  Continue current care There was an employee stick and pt had blood drawn and result for HepBSAg returned positive.  I discussed this with the patient and informed her that I would refer to ID to be seen within the first week or so of her discharge if possible.  The pediatrician will talk to her about recommendations for the baby. Iron daily D/C foley - UOP is good D/C PCA and switch to po meds Declines circumcision   Delice Lesch 08/28/2015, 10:52 AM

## 2015-08-28 NOTE — Progress Notes (Signed)
Dr. Mancel Bale notified of recent  Positive Hepatitis results. She will round shortly.

## 2015-08-28 NOTE — Progress Notes (Signed)
TC from RN--300 cc urine output from 7p-2mn, with concentrated urine noted. RN scanned bladder, with 400 cc urine noted. Appears to be issue of catheter adequacy.  Patient did get dizzy with ambulation. Instructed RN to remove current catheter, replace with new. IV bolus of 300 cc.  Will continue to observe.  Donnel Saxon, CNM 08/28/15 3:30a

## 2015-08-28 NOTE — Progress Notes (Signed)
Dr. Apolonio Schneiders and pediatrician M. Corinna Capra both have spoken with patient regarding her new positive Hepatitis B antigen results. HBIG and Vitamin K and Hep B vaccine given by CN nurse. PCA dc'd.

## 2015-08-28 NOTE — Progress Notes (Addendum)
Patient output 300 ml over 8 hrs, urine dark amber. Bladder scanned patient and showed 411 cc of retained urine. Also patient stated she was dizzy when attempting to stand at bedside. CNM V.Latham notified, ordered removal of old catheter and to replace with new catheter. Also ordered to give patient a 300 cc bolus and to keep new catheter in place until provider rounds in the a.m and until they are able to further assess patient. Urine output currently 200 cc, clear and yellow. Will continue to monitor patient.

## 2015-08-28 NOTE — Progress Notes (Signed)
Foley taken out without difficulty. 1200 noted . Encouraged to get up in two hours to try to void. All questions answered. Patient wants to wait for the PKU to be done later, since the infant has just had 3 vaccines.

## 2015-08-29 LAB — COMPREHENSIVE METABOLIC PANEL
ALK PHOS: 91 U/L (ref 38–126)
ALT: 11 U/L — AB (ref 14–54)
AST: 15 U/L (ref 15–41)
Albumin: 2.2 g/dL — ABNORMAL LOW (ref 3.5–5.0)
Anion gap: 7 (ref 5–15)
BUN: 8 mg/dL (ref 6–20)
CALCIUM: 8.3 mg/dL — AB (ref 8.9–10.3)
CO2: 26 mmol/L (ref 22–32)
CREATININE: 0.46 mg/dL (ref 0.44–1.00)
Chloride: 104 mmol/L (ref 101–111)
Glucose, Bld: 80 mg/dL (ref 65–99)
Potassium: 4.1 mmol/L (ref 3.5–5.1)
Sodium: 137 mmol/L (ref 135–145)
TOTAL PROTEIN: 4.9 g/dL — AB (ref 6.5–8.1)
Total Bilirubin: 0.4 mg/dL (ref 0.3–1.2)

## 2015-08-29 MED ORDER — OXYCODONE-ACETAMINOPHEN 5-325 MG PO TABS: 1.0000 | ORAL_TABLET | ORAL | Status: DC | PRN

## 2015-08-29 MED ORDER — FERROUS SULFATE 325 (65 FE) MG PO TABS: 325.0000 mg | ORAL_TABLET | Freq: Every day | ORAL | Status: DC

## 2015-08-29 MED ORDER — IBUPROFEN 600 MG PO TABS: 600.0000 mg | ORAL_TABLET | Freq: Four times a day (QID) | ORAL | Status: DC | PRN

## 2015-08-29 NOTE — Lactation Note (Signed)
This note was copied from a baby's chart. Lactation Consultation Note  Patient Name: Janet Jacobs [id number] Date: 08/29/2015 Reason for consult: Follow-up assessment   Follow up with Exp BF mom of 24 hour old infant. Infant with 10 BF for 10-30 minutes, 4 voids and 1 stool in last 24 hours. LATCH Scores 10 by bedside RN. Infant weight 7 lb 12.5 oz with 6% weight loss since birth.   Infant was laying on the bed sucking on a pacifier. Mom reports he is eating well. She reports her nipples are tender with latch, she is using EBM to nipples. Mom reports her breasts are feeling fuller. Mom without questions or concerns at this time. Enc mom to call with questions/concerns prn.   Maternal Data Has patient been taught Hand Expression?: Yes Does the patient have breastfeeding experience prior to this delivery?: Yes  Feeding Feeding Type: Breast Fed Length of feed: 15 min  LATCH Score/Interventions                      Lactation Tools Discussed/Used     Consult Status Consult Status: Follow-up Date: 08/30/15 Follow-up type: In-patient    Debby Freiberg Avedis Bevis 08/29/2015, 3:23 PM

## 2015-08-29 NOTE — Progress Notes (Signed)
Subjective: Postpartum Day 2: Cesarean Delivery Patient reports that pain is well-managed.Lochia normal.  Ambulating, voiding, tolerating diet as ordered without difficulty. Normal flatus. Absent bowel movement.  Objective: Vital signs in last 24 hours: Temp:  [98.2 F (36.8 C)-98.4 F (36.9 C)] 98.2 F (36.8 C) (05/25 0446) Pulse Rate:  [92-100] 92 (05/25 0446) Resp:  [18] 18 (05/25 0446) BP: (102-105)/(62-68) 102/62 mmHg (05/25 0446)  Physical Exam:  General: alert Lochia: appropriate Uterine Fundus: firm and appropriately tender Incision: dressing dry and clean DVT Evaluation: No evidence of DVT seen on physical exam. Edema 1+   Recent Labs  08/28/15 0501  HGB 7.7*  HCT 23.6*    Assessment/Plan: Status post Cesarean section. Doing well postoperatively.  Continue current care. Anticipate discharge this pm D/C instructions reviewed  Seibert Keeter A 08/29/2015, 10:52 AM

## 2015-08-29 NOTE — Discharge Summary (Signed)
Obstetric Discharge Summary  Reason for Admission: cesarean section on 08/23/15 for breech presentation Prenatal Procedures: none Intrapartum Procedures: cesarean: low cervical, transverse by Dr Alesia Richards on 08/27/15 Postpartum Procedures: none Complications-Operative and Postpartum: new positive HbsAg diagnosed 2nd to needle accident in the OR  HEMOGLOBIN  Date Value Ref Range Status  08/28/2015 7.7* 12.0 - 15.0 g/dL Final    Comment:    REPEATED TO VERIFY DELTA CHECK NOTED    HCT  Date Value Ref Range Status  08/28/2015 23.6* 36.0 - 46.0 % Final    Discharge Diagnoses: Term Pregnancy-delivered  Hospital course: uncomplicated  Date: 99991111 Activity: unrestricted Diet: routine Medications: Ibuprofen, Iron and Percocet Condition: stable  Breastfeeding:   Yes.   Contraception:  *Mirena  Instructions: refer to practice specific booklet Discharge to: home   Newborn Data:   Baby female  [facility] A MD 08/29/2015, 10:55 AM

## 2015-08-30 MED ORDER — DOCUSATE SODIUM 100 MG PO CAPS: 100.0000 mg | ORAL_CAPSULE | Freq: Two times a day (BID) | ORAL | Status: DC

## 2015-08-30 NOTE — Discharge Summary (Signed)
[facility] Discharge Summary   Patient Name:   Janet Jacobs DOB:     [birth year] MRN:     [id number]  Date of Admission:   08/27/2015 Date of Discharge:  08/30/2015  Admitting diagnosis:    Breech Presentation Principal Problem:   Status post primary low transverse cesarean section Active Problems:   Latex allergy   Breech presentation   BMI 45.0-49.9, adult (HCC)   AMA (advanced maternal age) multigravida 35+   History of termination of pregnancy x 2   Delivered by cesarean section  Anemia    Discharge diagnosis:    Breech Presentation Principal Problem:   Status post primary low transverse cesarean section Active Problems:   Latex allergy   Breech presentation   BMI 45.0-49.9, adult (HCC)   AMA (advanced maternal age) multigravida 35+   History of termination of pregnancy x 2   Delivered by cesarean section  Positive test for HBsAg Anemia                                                                     Post partum procedures: None  Type of Delivery:  Primary low transverse cesarean section  Delivering Provider: Waymon Amato   Date of Delivery:  08/27/2015  Newborn Data:    Live born female  Birth Weight: 8 lb 4.3 oz (3751 g) APGAR: 9, 9  Baby's Name:  Janet Jacobs Baby Feeding:   Breast Disposition:   home with mother  Complications:   Needle stick in the operating room. The patient was found to have a positive HBsAg test.  Hospital course:      Sceduled C/S   35 y.o. yo [id number] at [redacted]w[redacted]d was admitted to the hospital 08/27/2015 for scheduled cesarean section with the following indication:Malpresentation. Breech presentation. Membrane Rupture Time/Date: 8:13 AM ,08/27/2015   Patient delivered a Viable infant.08/27/2015  Details of operation can be found in separate operative note.  Pateint had an uncomplicated postpartum course.  She is ambulating, tolerating a regular diet, passing flatus, and urinating well. Patient is discharged home in  stable condition on  08/30/2015          Physical Exam:   Filed Vitals:   08/28/15 1900 08/29/15 0446 08/29/15 1845 08/30/15 0633  BP: 105/68 102/62 121/76 134/83  Pulse: 100 92 88 86  Temp:  98.2 F (36.8 C) 98 F (36.7 C) 98.1 F (36.7 C)  TempSrc:  Oral Oral Oral  Resp:  18 18 18   SpO2:       General: alert and no distress Lochia: appropriate Uterine Fundus: firm Incision: Healing well with no significant drainage DVT Evaluation: No evidence of DVT seen on physical exam.  Labs: Lab Results  Component Value Date   WBC 15.2* 08/28/2015   HGB 7.7* 08/28/2015   HCT 23.6* 08/28/2015   MCV 82.5 08/28/2015   PLT 216 08/28/2015   CMP Latest Ref Rng 08/29/2015  Glucose 65 - 99 mg/dL 80  BUN 6 - 20 mg/dL 8  Creatinine 0.44 - 1.00 mg/dL 0.46  Sodium 135 - 145 mmol/L 137  Potassium 3.5 - 5.1 mmol/L 4.1  Chloride 101 - 111 mmol/L 104  CO2 22 - 32 mmol/L 26  Calcium 8.9 - 10.3 mg/dL 8.3(L)  Total Protein 6.5 - 8.1 g/dL 4.9(L)  Total Bilirubin 0.3 - 1.2 mg/dL 0.4  Alkaline Phos 38 - 126 U/L 91  AST 15 - 41 U/L 15  ALT 14 - 54 U/L 11(L)    Discharge instruction: per After Visit Summary and "Baby and Me Booklet".  After Visit Meds:    Medication List    TAKE these medications        docusate sodium 100 MG capsule  Commonly known as:  COLACE  Take 1 capsule (100 mg total) by mouth 2 (two) times daily.     ferrous sulfate 325 (65 FE) MG tablet  Take 1 tablet (325 mg total) by mouth daily.     ibuprofen 600 MG tablet  Commonly known as:  ADVIL,MOTRIN  Take 1 tablet (600 mg total) by mouth every 6 (six) hours as needed for mild pain.     lansoprazole 30 MG capsule  Commonly known as:  PREVACID  Take 30 mg by mouth at bedtime.     oxyCODONE-acetaminophen 5-325 MG tablet  Commonly known as:  PERCOCET/ROXICET  Take 1 tablet by mouth every 4 (four) hours as needed (pain scale 4-7).     prenatal multivitamin Tabs tablet  Take 1 tablet by mouth at bedtime.         Diet: routine diet  Activity: Advance as tolerated. Pelvic rest for 6 weeks.   Outpatient follow up:6 weeks Follow up Appt:Future Appointments Date Time Provider [place]  09/04/2015 9:00 AM Truman Hayward, MD [facility]   Follow up visit: [facility] in 6 weeks  Postpartum contraception: IUD Mirena  08/30/2015 Eli Hose, MD

## 2015-08-30 NOTE — Lactation Note (Signed)
This note was copied from a baby's chart. Lactation Consultation Note  Patient Name: Janet Jacobs [id number] Date: 08/30/2015   Visited with Mom on day of discharge, baby 33 hrs old.  Mom states her milk volume coming in, and baby is latching well now.  8 BFs in last 24 hrs.  0 stools, 4 voids.  Weight check with Pediatrician 5/28.  Mom to continue to place baby skin to skin, and feed often on cue.  Engorgement prevention and treatment discussed.  Reminded Mom of OP lactation services available to her. To call prn.   Broadus John 08/30/2015, 11:18 AM

## 2015-08-30 NOTE — Discharge Instructions (Signed)
Cesarean Delivery Cesarean delivery is the birth of a baby through a cut (incision) in the abdomen and womb (uterus).  LET [facility] CARE PROVIDER KNOW ABOUT:  All medicines you are taking, including vitamins, herbs, eye drops, creams, and over-the-counter medicines.  Previous problems you or members of your family have had with the use of anesthetics.  Any bleeding or blood clotting disorders you have.  Family history of blood clots or bleeding disorders.  Any history of deep vein thrombosis (DVT) or pulmonary embolism (PE).  Previous surgeries you have had.  Medical conditions you have.  Any allergies you have.  Complicationsinvolving the pregnancy. RISKS AND COMPLICATIONS  Generally, this is a safe procedure. However, as with any procedure, complications can occur. Possible complications include:  Bleeding.  Infection.  Blood clots.  Injury to surrounding organs.  Problems with anesthesia.  Injury to the baby. BEFORE THE PROCEDURE   You may be given an antacid medicine to drink. This will prevent acid contents in your stomach from going into your lungs if you vomit during the surgery.  You may be given an antibiotic medicine to prevent infection. PROCEDURE   To prevent infection of your incision:  Hair may be removed from your pubic area if it is near your incision.  The skin of your pubic area and lower abdomen will be cleaned with a germ-killing solution (antiseptic).  A tube (Foley catheter) will be placed in your bladder to drain your urine from your bladder into a bag. This keeps your bladder empty during surgery.  An IV tube will be placed in your vein.  You may be given medicine to numb the lower half of your body (regional anesthetic). If you were in labor, you may have already had an epidural in place which can be used in both labor and cesarean delivery. You may possibly be given medicine to make you sleep (general anesthetic) though this is not as  common.  Your heart rate and your baby's heart rate will be monitored.  An incision will be made in your abdomen that extends to your uterus. There are 2 basic kinds of incisions:  The horizontal (transverse) incision. Horizontal incisions are from side to side and are used for most routine cesarean deliveries.  The vertical incision. The vertical incision is from the top of the abdomen to the bottom and is less commonly used. It is often done for women who have a serious complication (extreme prematurity) or under emergency situations.  The horizontal and vertical incisions may both be used at the same time. However, this is very uncommon.  An incision is then made in your uterus to deliver the baby.  Your baby will be delivered.  Your health care provider may place the baby on your chest. It is important to keep the baby warm. Your health care provider will dry off the baby, place the baby directly on your bare skin, and cover the baby with warm, dry blankets.  Both incisions will be closed with absorbable stitches. AFTER THE PROCEDURE   If you were awake during the surgery, you will see your baby right away. If you were asleep, you will see your baby as soon as you are awake.  You may breastfeed your baby after surgery.  You may be able to get up and walk the same day as the surgery. If you need to stay in bed for a period of time, you will receive help to turn, cough, and take deep breaths after  surgery. This helps prevent lung problems such as pneumonia.  Do not get out of bed alone the first time after surgery. You will need help getting out of bed until you are able to do this by yourself.  You may be able to shower the day after your cesarean delivery. After the bandage (dressing) is taken off the incision site, a nurse will assist you to shower if you would like help.  You may be directed to take actions to help prevent blood clots in your legs. These may  include:  Walking shortly after surgery, with someone assisting you. Moving around after surgery helps to improve blood flow.  Wearing compression stockings or using different types of devices.  Taking medicines to thin your blood (anticoagulants) if you are at high risk for DVT or PE.  Save any blood clots that you pass from your vagina. If you pass a clot while on the toilet, do not flush it. Call for the nurse. Tell the nurse if you think you are bleeding too much or passing too many clots.  You will be given medicine for pain and nausea as needed. Let your health care providers know if you are hurting. You may also be given an antibiotic to prevent an infection.  Your IV tube will be taken out when you are drinking a reasonable amount of fluids. The Foley catheter is taken out when you are up and walking.  If your blood type is Rh negative and your baby's blood type is Rh positive, you will be given a shot of anti-D immune globulin. This shot prevents you from having Rh problems with a future pregnancy. You should get the shot even if you had your tubes tied (tubal ligation).  If you are allowed to take the baby for a walk, place the baby in the bassinet and push it.   This information is not intended to replace advice given to you by your health care provider. Make sure you discuss any questions you have with your health care provider.   Document Released: 03/23/2005 Document Revised: 12/12/2014 Document Reviewed: 11/18/2011 Elsevier Interactive Patient Education 2016 [employer]. Postpartum Depression and Baby Blues The postpartum period begins right after the birth of a baby. During this time, there is often a great amount of joy and excitement. It is also a time of many changes in the life of the parents. Regardless of how many times a mother gives birth, each child brings new challenges and dynamics to the family. It is not unusual to have feelings of excitement along with confusing  shifts in moods, emotions, and thoughts. All mothers are at risk of developing postpartum depression or the "baby blues." These mood changes can occur right after giving birth, or they may occur many months after giving birth. The baby blues or postpartum depression can be mild or severe. Additionally, postpartum depression can go away rather quickly, or it can be a long-term condition.  CAUSES Raised hormone levels and the rapid drop in those levels are thought to be a main cause of postpartum depression and the baby blues. A number of hormones change during and after pregnancy. Estrogen and progesterone usually decrease right after the delivery of your baby. The levels of thyroid hormone and various cortisol steroids also rapidly drop. Other factors that play a role in these mood changes include major life events and genetics.  RISK FACTORS If you have any of the following risks for the baby blues or postpartum depression, know  what symptoms to watch out for during the postpartum period. Risk factors that may increase the likelihood of getting the baby blues or postpartum depression include:  Having a personal or family history of depression.   Having depression while being pregnant.   Having premenstrual mood issues or mood issues related to oral contraceptives.  Having a lot of life stress.   Having marital conflict.   Lacking a social support network.   Having a baby with special needs.   Having health problems, such as diabetes.  SIGNS AND SYMPTOMS Symptoms of baby blues include:  Brief changes in mood, such as going from extreme happiness to sadness.  Decreased concentration.   Difficulty sleeping.   Crying spells, tearfulness.   Irritability.   Anxiety.  Symptoms of postpartum depression typically begin within the first month after giving birth. These symptoms include:  Difficulty sleeping or excessive sleepiness.   Marked weight loss.   Agitation.    Feelings of worthlessness.   Lack of interest in activity or food.  Postpartum psychosis is a very serious condition and can be dangerous. Fortunately, it is rare. Displaying any of the following symptoms is cause for immediate medical attention. Symptoms of postpartum psychosis include:   Hallucinations and delusions.   Bizarre or disorganized behavior.   Confusion or disorientation.  DIAGNOSIS  A diagnosis is made by an evaluation of your symptoms. There are no medical or lab tests that lead to a diagnosis, but there are various questionnaires that a health care provider may use to identify those with the baby blues, postpartum depression, or psychosis. Often, a screening tool called the [place] Postnatal Depression Scale is used to diagnose depression in the postpartum period.  TREATMENT The baby blues usually goes away on its own in 1-2 weeks. Social support is often all that is needed. You will be encouraged to get adequate sleep and rest. Occasionally, you may be given medicines to help you sleep.  Postpartum depression requires treatment because it can last several months or longer if it is not treated. Treatment may include individual or group therapy, medicine, or both to address any social, physiological, and psychological factors that may play a role in the depression. Regular exercise, a healthy diet, rest, and social support may also be strongly recommended.  Postpartum psychosis is more serious and needs treatment right away. Hospitalization is often needed. HOME CARE INSTRUCTIONS  Get as much rest as you can. Nap when the baby sleeps.   Exercise regularly. Some women find yoga and walking to be beneficial.   Eat a balanced and nourishing diet.   Do little things that you enjoy. Have a cup of tea, take a bubble bath, read your favorite magazine, or listen to your favorite music.  Avoid alcohol.   Ask for help with household chores, cooking, grocery shopping,  or running errands as needed. Do not try to do everything.   Talk to people close to you about how you are feeling. Get support from your partner, family members, friends, or other new moms.  Try to stay positive in how you think. Think about the things you are grateful for.   Do not spend a lot of time alone.   Only take over-the-counter or prescription medicine as directed by your health care provider.  Keep all your postpartum appointments.   Let your health care provider know if you have any concerns.  SEEK MEDICAL CARE IF: You are having a reaction to or problems with your medicine. SEEK  IMMEDIATE MEDICAL CARE IF:  You have suicidal feelings.   You think you may harm the baby or someone else. MAKE SURE YOU:  Understand these instructions.  Will watch your condition.  Will get help right away if you are not doing well or get worse.   This information is not intended to replace advice given to you by your health care provider. Make sure you discuss any questions you have with your health care provider.   Document Released: 12/26/2003 Document Revised: 03/28/2013 Document Reviewed: 01/02/2013 Elsevier Interactive Patient Education [employer].

## 2015-09-03 ENCOUNTER — Inpatient Hospital Stay (HOSPITAL_COMMUNITY)
Admission: AD | Admit: 2015-09-03 | Discharge: 2015-09-03 | Disposition: A | Discharge status: Home / Self Care | Payer: Medicaid Other | Source: Ambulatory Visit | Attending: Obstetrics & Gynecology | Admitting: Obstetrics & Gynecology

## 2015-09-03 DIAGNOSIS — Z833 Family history of diabetes mellitus: Secondary | ICD-10-CM | POA: Insufficient documentation

## 2015-09-03 DIAGNOSIS — Z8249 Family history of ischemic heart disease and other diseases of the circulatory system: Secondary | ICD-10-CM | POA: Insufficient documentation

## 2015-09-03 DIAGNOSIS — O169 Unspecified maternal hypertension, unspecified trimester: Secondary | ICD-10-CM

## 2015-09-03 DIAGNOSIS — O10919 Unspecified pre-existing hypertension complicating pregnancy, unspecified trimester: Secondary | ICD-10-CM

## 2015-09-03 DIAGNOSIS — R51 Headache: Secondary | ICD-10-CM | POA: Diagnosis present

## 2015-09-03 DIAGNOSIS — Z91040 Latex allergy status: Secondary | ICD-10-CM | POA: Insufficient documentation

## 2015-09-03 DIAGNOSIS — R519 Headache, unspecified: Secondary | ICD-10-CM

## 2015-09-03 DIAGNOSIS — O9089 Other complications of the puerperium, not elsewhere classified: Secondary | ICD-10-CM | POA: Insufficient documentation

## 2015-09-03 DIAGNOSIS — D649 Anemia, unspecified: Secondary | ICD-10-CM | POA: Diagnosis not present

## 2015-09-03 DIAGNOSIS — O9081 Anemia of the puerperium: Secondary | ICD-10-CM | POA: Insufficient documentation

## 2015-09-03 DIAGNOSIS — I10 Essential (primary) hypertension: Secondary | ICD-10-CM

## 2015-09-03 DIAGNOSIS — R030 Elevated blood-pressure reading, without diagnosis of hypertension: Secondary | ICD-10-CM | POA: Diagnosis not present

## 2015-09-03 DIAGNOSIS — IMO0001 Reserved for inherently not codable concepts without codable children: Secondary | ICD-10-CM

## 2015-09-03 LAB — URINALYSIS, ROUTINE W REFLEX MICROSCOPIC
BILIRUBIN URINE: NEGATIVE
Glucose, UA: NEGATIVE mg/dL
KETONES UR: NEGATIVE mg/dL
LEUKOCYTES UA: NEGATIVE
NITRITE: NEGATIVE
Protein, ur: NEGATIVE mg/dL
SPECIFIC GRAVITY, URINE: 1.01 (ref 1.005–1.030)
pH: 5.5 (ref 5.0–8.0)

## 2015-09-03 LAB — CBC
HCT: 21.6 % — ABNORMAL LOW (ref 36.0–46.0)
Hemoglobin: 6.9 g/dL — CL (ref 12.0–15.0)
MCH: 26.6 pg (ref 26.0–34.0)
MCHC: 31.9 g/dL (ref 30.0–36.0)
MCV: 83.4 fL (ref 78.0–100.0)
PLATELETS: 330 10*3/uL (ref 150–400)
RBC: 2.59 MIL/uL — AB (ref 3.87–5.11)
RDW: 15.6 % — AB (ref 11.5–15.5)
WBC: 11.1 10*3/uL — AB (ref 4.0–10.5)

## 2015-09-03 LAB — PROTEIN / CREATININE RATIO, URINE
CREATININE, URINE: 77 mg/dL
Protein Creatinine Ratio: 0.09 mg/mg{Cre} (ref 0.00–0.15)
TOTAL PROTEIN, URINE: 7 mg/dL

## 2015-09-03 LAB — COMPREHENSIVE METABOLIC PANEL
ALT: 16 U/L (ref 14–54)
AST: 15 U/L (ref 15–41)
Albumin: 2.8 g/dL — ABNORMAL LOW (ref 3.5–5.0)
Alkaline Phosphatase: 90 U/L (ref 38–126)
Anion gap: 9 (ref 5–15)
BUN: 12 mg/dL (ref 6–20)
CHLORIDE: 103 mmol/L (ref 101–111)
CO2: 25 mmol/L (ref 22–32)
Calcium: 8.5 mg/dL — ABNORMAL LOW (ref 8.9–10.3)
Creatinine, Ser: 0.59 mg/dL (ref 0.44–1.00)
GFR calc Af Amer: >60 mL/min (ref >60)
Glucose, Bld: 115 mg/dL — ABNORMAL HIGH (ref 65–99)
POTASSIUM: 4.1 mmol/L (ref 3.5–5.1)
SODIUM: 137 mmol/L (ref 135–145)
Total Bilirubin: 1.1 mg/dL (ref 0.3–1.2)
Total Protein: 6.4 g/dL — ABNORMAL LOW (ref 6.5–8.1)

## 2015-09-03 LAB — URINE MICROSCOPIC-ADD ON: WBC UA: NONE SEEN WBC/hpf (ref 0–5)

## 2015-09-03 MED ORDER — FERROUS SULFATE 325 (65 FE) MG PO TABS: 325.0000 mg | ORAL_TABLET | Freq: Two times a day (BID) | ORAL | Status: DC

## 2015-09-03 NOTE — Discharge Instructions (Signed)
Postpartum Hypertension Postpartum hypertension is high blood pressure after pregnancy that remains higher than normal for more than two days after delivery. You may not realize that you have postpartum hypertension if your blood pressure is not being checked regularly. In some cases, postpartum hypertension will go away on its own, usually within a week of delivery. However, for some women, medical treatment is required to prevent serious complications, such as seizures or stroke. The following things can affect your blood pressure:  The type of delivery you had.  Having received IV fluids or other medicines during or after delivery. CAUSES  Postpartum hypertension may be caused by any of the following or by a combination of any of the following:  Hypertension that existed before pregnancy (chronic hypertension).  Gestational hypertension.  Preeclampsia or eclampsia.  Receiving a lot of fluid through an IV during or after delivery.  Medicines.  HELLP syndrome.  Hyperthyroidism.  Stroke.  Other rare neurological or blood disorders. In some cases, the cause may not be known. RISK FACTORS Postpartum hypertension can be related to one or more risk factors, such as:  Chronic hypertension. In some cases, this may not have been diagnosed before pregnancy.  Obesity.  Type 2 diabetes.  Kidney disease.  Family history of preeclampsia.  Other medical conditions that cause hormonal imbalances. SIGNS AND SYMPTOMS As with all types of hypertension, postpartum hypertension may not have any symptoms. Depending on how high your blood pressure is, you may experience:  Headaches. These may be mild, moderate, or severe. They may also be steady, constant, or sudden in onset (thunderclap headache).  Visual changes.  Dizziness.  Shortness of breath.  Swelling of your hands, feet, lower legs, or face. In some cases, you may have swelling in more than one of these locations.  Heart  palpitations or a racing heartbeat.  Difficulty breathing while lying down.  Decreased urination. Other rare signs and symptoms may include:  Sweating more than usual. This lasts longer than a few days after delivery.  Chest pain.  Sudden dizziness when you get up from sitting or lying down.  Seizures.  Nausea or vomiting.  Abdominal pain. DIAGNOSIS The diagnosis of postpartum hypertension is made through a combination of physical examination findings and testing of your blood and urine. You may also have additional tests, such as a CT scan or an MRI, to check for other complications of postpartum hypertension. TREATMENT When blood pressure is high enough to require treatment, your options may include:  Medicines to reduce blood pressure (antihypertensives). Tell your health care provider if you are breastfeeding or if you plan to breastfeed. There are many antihypertensive medicines that are safe to take while breastfeeding.  Stopping medicines that may be causing hypertension.  Treating medical conditions that are causing hypertension.  Treating the complications of hypertension, such as seizures, stroke, or kidney problems. Your health care provider will also continue to monitor your blood pressure closely and repeatedly until it is within a safe range for you.  HOME CARE INSTRUCTIONS  Take medicines only as directed by your health care provider.  Get regular exercise after your health care provider tells you that it is safe.  Follow your health care provider's recommendations on fluid and salt restrictions.  Do not use any tobacco products, including cigarettes, chewing tobacco, or electronic cigarettes. If you need help quitting, ask your health care provider.  Keep all follow-up visits as directed by your health care provider. This is important. SEEK MEDICAL CARE IF:  Your symptoms get worse.  You have new symptoms, such as:  Headache.  Dizziness.  Visual  changes. SEEK IMMEDIATE MEDICAL CARE IF:  You develop a severe or sudden headache.  You have seizures.  You develop numbness or weakness on one side of your body.  You have difficulty thinking, speaking, or swallowing.  You develop severe abdominal pain.  You develop difficulty breathing, chest pain, a racing heartbeat, or heart palpitations. These symptoms may represent a serious problem that is an emergency. Do not wait to see if the symptoms will go away. Get medical help right away. Call your local emergency services (911 in the [place] Do not drive yourself to the hospital.   This information is not intended to replace advice given to you by your health care provider. Make sure you discuss any questions you have with your health care provider.   Document Released: 11/24/2013 Document Reviewed: 11/24/2013 Elsevier Interactive Patient Education [employer].

## 2015-09-03 NOTE — MAU Note (Signed)
Pt states she was at MD's office today due to swelling that has worsened since her C/S last week and has had a headache off/on.

## 2015-09-03 NOTE — MAU Provider Note (Signed)
History  Janet Jacobs, s/p c-section 8 days ago presents to MAU after being seen in the office today for intermittent headache in spite of meds, and increased pain/swelling to LE. No visual disturbances, CP, SOB or severe N/V.   Patient Active Problem List   Diagnosis Date Noted  . Headache 09/03/2015  . Elevated blood pressure 09/03/2015  . Delivered by cesarean section 08/27/2015  . Status post primary low transverse cesarean section 08/27/2015  . Latex allergy 08/23/2015  . BMI 45.0-49.9, adult ([place]) 08/23/2015  . AMA (advanced maternal age) multigravida 35+ 08/23/2015  . History of termination of pregnancy x 2 08/23/2015    No chief complaint on file.  HPI  As above  OB History as of 09/03/15    Gravida Para Term Preterm AB TAB SAB Ectopic Multiple Living   '5 3 3 '$ 0 2 2 0 0 0 3      Past Medical History  Diagnosis Date  . No pertinent past medical history   . Medical history non-contributory   . Complication of anesthesia     HAS RESISTANCE TO LIDOCAINE SETTING UP AT DENTIST AND PAIN CONTROL.  wOKE UP DURING GENERAL ANESTHESIA FOR WISDOME TEETH    Past Surgical History  Procedure Laterality Date  . Wisdom tooth extraction    . No past surgeries    . Cesarean section N/A 08/27/2015    Procedure: CESAREAN SECTION;  Surgeon: Waymon Amato, MD;  Location: [place];  Service: Obstetrics;  Laterality: N/A;    Family History  Problem Relation Age of Onset  . Hypertension Mother   . Miscarriages / [place] Mother   . Arthritis Father   . Diabetes Father   . Hypertension Father   . Alcohol abuse Neg Hx   . Asthma Neg Hx   . Birth defects Neg Hx   . Cancer Neg Hx   . COPD Neg Hx   . Depression Neg Hx   . Drug abuse Neg Hx   . Early death Neg Hx   . Hearing loss Neg Hx   . Heart disease Neg Hx   . Hyperlipidemia Neg Hx   . Kidney disease Neg Hx   . Learning disabilities Neg Hx   . Mental illness Neg Hx   . Mental retardation Neg Hx   . Stroke  Neg Hx   . Vision loss Neg Hx     Social History  Substance Use Topics  . Smoking status: Never Smoker   . Smokeless tobacco: Never Used  . Alcohol Use: No    Allergies:  Allergies  Allergen Reactions  . Latex Hives, Itching and Rash    No prescriptions prior to admission    ROS  As per HPI. Physical Exam   Results for orders placed or performed during the hospital encounter of 09/03/15 (from the past 48 hour(s))  Protein / creatinine ratio, urine     Status: None   Collection Time: 09/03/15  7:19 PM  Result Value Ref Range   Creatinine, Urine 77.00 mg/dL   Total Protein, Urine 7 mg/dL    Comment: NO NORMAL RANGE ESTABLISHED FOR THIS TEST   Protein Creatinine Ratio 0.09 0.00 - 0.15 mg/mg[Cre]  Urinalysis, Routine w reflex microscopic (not at [facility])     Status: Abnormal   Collection Time: 09/03/15  7:19 PM  Result Value Ref Range   Color, Urine YELLOW YELLOW   APPearance CLEAR CLEAR   Specific Gravity, Urine 1.010 1.005 - 1.030  pH 5.5 5.0 - 8.0   Glucose, UA NEGATIVE NEGATIVE mg/dL   Hgb urine dipstick SMALL (A) NEGATIVE   Bilirubin Urine NEGATIVE NEGATIVE   Ketones, ur NEGATIVE NEGATIVE mg/dL   Protein, ur NEGATIVE NEGATIVE mg/dL   Nitrite NEGATIVE NEGATIVE   Leukocytes, UA NEGATIVE NEGATIVE  Urine microscopic-add on     Status: Abnormal   Collection Time: 09/03/15  7:19 PM  Result Value Ref Range   Squamous Epithelial / LPF 0-5 (A) NONE SEEN   WBC, UA NONE SEEN 0 - 5 WBC/hpf   RBC / HPF 0-5 0 - 5 RBC/hpf   Bacteria, UA RARE (A) NONE SEEN  CBC     Status: Abnormal   Collection Time: 09/03/15  7:24 PM  Result Value Ref Range   WBC 11.1 (H) 4.0 - 10.5 K/uL   RBC 2.59 (L) 3.87 - 5.11 MIL/uL   Hemoglobin 6.9 (LL) 12.0 - 15.0 g/dL    Comment: CRITICAL RESULT CALLED TO, READ BACK BY AND VERIFIED WITH: WESTON,LYNETTE AT 1955 ON 09/03/2015 BY HOUEGNIFIO M    HCT 21.6 (L) 36.0 - 46.0 %   MCV 83.4 78.0 - 100.0 fL   MCH 26.6 26.0 - 34.0 pg   MCHC 31.9 30.0 - 36.0  g/dL   RDW 25.4 (H) 83.2 - 34.6 %   Platelets 330 150 - 400 K/uL  Comprehensive metabolic panel     Status: Abnormal   Collection Time: 09/03/15  7:24 PM  Result Value Ref Range   Sodium 137 135 - 145 mmol/L   Potassium 4.1 3.5 - 5.1 mmol/L   Chloride 103 101 - 111 mmol/L   CO2 25 22 - 32 mmol/L   Glucose, Bld 115 (H) 65 - 99 mg/dL   BUN 12 6 - 20 mg/dL   Creatinine, Ser 8.87 0.44 - 1.00 mg/dL   Calcium 8.5 (L) 8.9 - 10.3 mg/dL   Total Protein 6.4 (L) 6.5 - 8.1 g/dL   Albumin 2.8 (L) 3.5 - 5.0 g/dL   AST 15 15 - 41 U/L   ALT 16 14 - 54 U/L   Alkaline Phosphatase 90 38 - 126 U/L   Total Bilirubin 1.1 0.3 - 1.2 mg/dL   GFR calc non Af Amer >60 >60 mL/min   GFR calc Af Amer >60 >60 mL/min    Comment: (NOTE) The eGFR has been calculated using the CKD EPI equation. This calculation has not been validated in all clinical situations. eGFR's persistently <60 mL/min signify possible Chronic Kidney Disease.    Anion gap 9 5 - 15   Blood pressure 135/89, pulse 69, temperature 98.2 F (36.8 C), temperature source Oral, resp. rate 20, height 5\' 4"  (1.626 m), weight 131.09 kg (289 lb), last menstrual period 11/20/2014, SpO2 100 %, unknown if currently breastfeeding. Today's Vitals   09/03/15 2049 09/03/15 2104 09/03/15 2119 09/03/15 2134  BP: 134/77 145/88 143/85 135/89  Pulse: 76 77 71 69  Temp:      TempSrc:      Resp:      Height:      Weight:      SpO2:         Physical Exam  Gen: NAD. Neuro: Grossly intact w/o focal deficits. Lungs: CTAB. CV: RRR w/o M/R/G. Ext: Pitting leg/ankle/feet edema, tender     ED Course  Assessment: Elevated BPs Normal physiological changes in the pp period Anemia  Plan: Consulted Dr. 09/05/15 who reviewed BPs and normal labs. Hemoglobin down to 6.9, was 7.7  on 08/28/15. Per Dr. Simona Huh, pt needs f/u office visit this week for BP evaluation. Will have office contact pt. Advised to increase Iron to bid (w/ orange juice) and watch for  constipation - dietary foods to minimize constipation reviewed. Advised to elevated feet when sitting/supine. If no improvement with these measures, needs evaluation.   Farrel Gordon CNM, MS 09/04/2015 1:45 AM

## 2015-09-04 ENCOUNTER — Encounter: Payer: Self-pay | Admitting: Infectious Disease

## 2015-09-04 ENCOUNTER — Ambulatory Visit (INDEPENDENT_AMBULATORY_CARE_PROVIDER_SITE_OTHER): Payer: Medicaid Other | Admitting: Infectious Disease

## 2015-09-04 VITALS — BP 138/86 | HR 86 | Temp 97.8°F | Wt 278.0 lb

## 2015-09-04 DIAGNOSIS — B181 Chronic viral hepatitis B without delta-agent: Secondary | ICD-10-CM | POA: Diagnosis not present

## 2015-09-04 HISTORY — DX: Chronic viral hepatitis B without delta-agent: B18.1

## 2015-09-04 LAB — COMPLETE METABOLIC PANEL WITH GFR
ALBUMIN: 3.1 g/dL — AB (ref 3.6–5.1)
ALK PHOS: 99 U/L (ref 33–115)
ALT: 11 U/L (ref 6–29)
AST: 13 U/L (ref 10–30)
BILIRUBIN TOTAL: 0.9 mg/dL (ref 0.2–1.2)
BUN: 13 mg/dL (ref 7–25)
CO2: 24 mmol/L (ref 20–31)
Calcium: 8.4 mg/dL — ABNORMAL LOW (ref 8.6–10.2)
Chloride: 104 mmol/L (ref 98–110)
Creat: 0.57 mg/dL (ref 0.50–1.10)
GFR, Est African American: >89 mL/min (ref >=60)
GLUCOSE: 78 mg/dL (ref 65–99)
Potassium: 4.4 mmol/L (ref 3.5–5.3)
SODIUM: 139 mmol/L (ref 135–146)
TOTAL PROTEIN: 5.9 g/dL — AB (ref 6.1–8.1)

## 2015-09-04 LAB — HEPATITIS B SURFACE ANTIGEN: HEP B S AG: NEGATIVE

## 2015-09-04 LAB — HEPATITIS A ANTIBODY, TOTAL: Hep A Total Ab: NONREACTIVE

## 2015-09-04 LAB — HIV ANTIBODY (ROUTINE TESTING W REFLEX): HIV 1&2 Ab, 4th Generation: NONREACTIVE

## 2015-09-04 LAB — HEPATITIS B SURFACE ANTIBODY,QUALITATIVE

## 2015-09-04 LAB — HEPATITIS C ANTIBODY: HCV Ab: NEGATIVE

## 2015-09-04 NOTE — MAU Note (Signed)
Critical lab value Hgb 6.9 reported to Smith,CNM

## 2015-09-04 NOTE — Progress Notes (Signed)
Reason for Consult: New hepatitis B infection in late pregnancy  Requesting Physician: Dr. Nancy Fetter  Subjective:    Patient ID: Janet Jacobs, female    DOB: [birth year], 35 y.o.   MRN: [id number]  HPI  35 year old African-American lady who recently had delivery of baby via C-section due to breech presentation. If my understanding is correct apparently there was a needlestick exposure with an employee with the patient and the patient was tested for hepatitis B and found to be hepatitis B surface antigen positive during this admission. Note previously during her pregnancy in January she tested negative for hepatitis B along with HIV and other routine infectious disease labs. I do not see that she was retested for HIV late in pregnancy. She is referred to [place] for evaluation and possible treatment for hepatitis B.  She comes today with her husband as well as her newborn child. She is nursing the child while we began the interview today.  Upon reviewing her recent history she denies ever having any history of intravenous drug use. She states she's only been sexual active with her husband. Her husband states that he has not had other partners outside of the marriage and that he believes it is possible that if he has hepatitis B that he could've contract "in the entertainment industry" where he works and does lots of cleaning of arena's etc. He wonders if he could have contracted Hep B from an inanimate object contaminated with blood or saliva.   I told him and the patient that contracting hepatitis B from inanimate objects such as a needle while possible was not the most likely needs of contracting it.  I explained to the patient that she might clear her hepatitis B and might not require treatment but that she did not clear it we would then be assessing her for need for treatment. Also told her we need to check for HIV infection at again as.  Past Medical History  Diagnosis Date  . No pertinent  past medical history   . Medical history non-contributory   . Complication of anesthesia     HAS RESISTANCE TO LIDOCAINE SETTING UP AT DENTIST AND PAIN CONTROL.  wOKE UP DURING GENERAL ANESTHESIA FOR WISDOME TEETH  . Chronic hepatitis B without delta agent without cirrhosis ([place]) 09/04/2015    Past Surgical History  Procedure Laterality Date  . Wisdom tooth extraction    . No past surgeries    . Cesarean section N/A 08/27/2015    Procedure: CESAREAN SECTION;  Surgeon: Waymon Amato, MD;  Location: [facility];  Service: Obstetrics;  Laterality: N/A;    Family History  Problem Relation Age of Onset  . Hypertension Mother   . Miscarriages / [place] Mother   . Arthritis Father   . Diabetes Father   . Hypertension Father   . Alcohol abuse Neg Hx   . Asthma Neg Hx   . Birth defects Neg Hx   . Cancer Neg Hx   . COPD Neg Hx   . Depression Neg Hx   . Drug abuse Neg Hx   . Early death Neg Hx   . Hearing loss Neg Hx   . Heart disease Neg Hx   . Hyperlipidemia Neg Hx   . Kidney disease Neg Hx   . Learning disabilities Neg Hx   . Mental illness Neg Hx   . Mental retardation Neg Hx   . Stroke Neg Hx   . Vision loss Neg Hx  Social History   Social History  . Marital Status: Married    Spouse Name: N/A  . Number of Children: N/A  . Years of Education: N/A   Social History Main Topics  . Smoking status: Never Smoker   . Smokeless tobacco: Never Used  . Alcohol Use: No  . Drug Use: No  . Sexual Activity: Yes   Other Topics Concern  . None   Social History Narrative    Allergies  Allergen Reactions  . Latex Hives, Itching and Rash     Current outpatient prescriptions:  .  docusate sodium (COLACE) 100 MG capsule, Take 1 capsule (100 mg total) by mouth 2 (two) times daily., Disp: 60 capsule, Rfl: 2 .  ferrous sulfate 325 (65 FE) MG tablet, Take 1 tablet (325 mg total) by mouth 2 (two) times daily. Take with orange juice for better absorption., Disp: 60  tablet, Rfl: 3 .  ibuprofen (ADVIL,MOTRIN) 600 MG tablet, Take 1 tablet (600 mg total) by mouth every 6 (six) hours as needed for mild pain., Disp: 30 tablet, Rfl: 1 .  lansoprazole (PREVACID) 30 MG capsule, Take 30 mg by mouth at bedtime., Disp: , Rfl:  .  oxyCODONE-acetaminophen (PERCOCET/ROXICET) 5-325 MG tablet, Take 1 tablet by mouth every 4 (four) hours as needed (pain scale 4-7)., Disp: 30 tablet, Rfl: 0 .  Prenatal Vit-Fe Fumarate-FA (PRENATAL MULTIVITAMIN) TABS, Take 1 tablet by mouth at bedtime. , Disp: , Rfl:      Review of Systems  Constitutional: Positive for fatigue. Negative for fever, chills, diaphoresis, activity change, appetite change and unexpected weight change.  HENT: Negative for congestion, rhinorrhea, sinus pressure, sneezing, sore throat and trouble swallowing.   Eyes: Negative for photophobia and visual disturbance.  Respiratory: Negative for cough, chest tightness, shortness of breath, wheezing and stridor.   Cardiovascular: Negative for chest pain, palpitations and leg swelling.  Gastrointestinal: Negative for nausea, vomiting, abdominal pain, diarrhea, constipation, blood in stool, abdominal distention and anal bleeding.  Genitourinary: Negative for dysuria, hematuria, flank pain and difficulty urinating.  Musculoskeletal: Negative for myalgias, back pain, joint swelling, arthralgias and gait problem.  Skin: Negative for color change, pallor, rash and wound.  Neurological: Negative for dizziness, tremors, weakness and light-headedness.  Hematological: Negative for adenopathy. Does not bruise/bleed easily.  Psychiatric/Behavioral: Negative for behavioral problems, confusion, sleep disturbance, dysphoric mood, decreased concentration and agitation.       Objective:   Physical Exam  Constitutional: She is oriented to person, place, and time. She appears well-developed and well-nourished. No distress.  HENT:  Head: Normocephalic and atraumatic.  Mouth/Throat:  No oropharyngeal exudate.  Eyes: Conjunctivae and EOM are normal. No scleral icterus.  Neck: Normal range of motion. Neck supple.  Cardiovascular: Normal rate and regular rhythm.   Pulmonary/Chest: Effort normal. No respiratory distress. She has no wheezes.  Abdominal: She exhibits no distension.  Musculoskeletal: She exhibits no edema or tenderness.  Neurological: She is alert and oriented to person, place, and time. She exhibits normal muscle tone. Coordination normal.  Skin: Skin is warm and dry. No rash noted. She is not diaphoretic. No erythema. No pallor.  Psychiatric: She has a normal mood and affect. Her behavior is normal. Judgment and thought content normal.   Nursing infant       Assessment & Plan:   Newly acquired Hepatitis B: I will repeat hepatitis B surface antigen and hepatitis B DNA, hepatitis B E antigen, antibody to hepatitis B E antigen, hepatitis A, hepatitis C as well  as an HIV antibody and ultra quantitative HIV RNA.  If she truly is not using intravenous drugs than the most likely means of transmission whereby this patient acquired hepatitis B would be from her husband.  This to me raises a "Red flag" that the patient is at risk not just for Hepatitis B but also HIV, HCV and that there is likely extramarital involvement of partner (s) with hepatitis B.   Her husband also certainly needs to be tested to confirm if he indeed also has Hep B and he should be tested for HIV, HCV.   Obtaining separate sexual histories for husband and wife would be informative  I would STRONGLY recommend regular HIV testing  If her labs are reassuring from standpoint of not needing to treat her Hepatitis B then I will plan on seeing her back in 6 months and rechecking her labs including her HIV.   I will ask her to make sure he husband is checked for HIV, HCV along with HBV  I spent greater than 45 minutes with the patient including greater than 50% of time in face to face counsel  of the patient and her husband re acute hepatitis B infection chronic hip has been infection nature's of treatment have a see a and HIV and other blood-borne pathogens and sexually transmitted viruses and in coordination of her  care.

## 2015-09-05 LAB — HIV-1 RNA ULTRAQUANT REFLEX TO GENTYP+

## 2015-09-06 ENCOUNTER — Telehealth: Payer: Self-pay | Admitting: Infectious Disease

## 2015-09-06 LAB — HEPATITIS B E ANTIBODY: HEPATITIS BE ANTIBODY: NONREACTIVE

## 2015-09-06 LAB — HEPATITIS B E ANTIGEN: Hepatitis Be Antigen: NONREACTIVE

## 2015-09-06 NOTE — Telephone Encounter (Signed)
This makes no sense at all. This means that she doesn't have chronic hep B. I wonder if lab can add hep B core ab to it so we can determine if the indeterminate hbsab is from infection or vaccine.

## 2015-09-06 NOTE — Telephone Encounter (Signed)
So far Mrs Janet Jacobs tests DO NOT SHOW ANY EVIDENCE of Hepatitis B infection. Her hep SAG is negative.  I am awaiting HBV DNA  I do not have access tot he labs done by Ob at least I cannot see them in Epic  I left a message on her phone for her to call  Back.  Minh you think they maybe got a core antibody back I cant even see that and shouldn't that show up in Epic??  I almost wonder if this is the correct patient. It is referenced in her chart but where is/are the labs? From her admission

## 2015-09-09 LAB — HEPATITIS B DNA, ULTRAQUANTITATIVE, PCR

## 2015-09-10 NOTE — Telephone Encounter (Signed)
Even Hep B DNA was negative. I called her and let her know.  Can someone cancel her future appt?

## 2015-09-10 NOTE — Telephone Encounter (Signed)
Excellent

## 2015-09-10 NOTE — Telephone Encounter (Signed)
I don't see any future appt made so I think we are good.

## 2017-09-01 DIAGNOSIS — M7918 Myalgia, other site: Secondary | ICD-10-CM | POA: Diagnosis not present

## 2017-09-01 DIAGNOSIS — M545 Low back pain: Secondary | ICD-10-CM | POA: Diagnosis not present

## 2017-09-01 DIAGNOSIS — M9904 Segmental and somatic dysfunction of sacral region: Secondary | ICD-10-CM | POA: Diagnosis not present

## 2017-09-01 DIAGNOSIS — M9903 Segmental and somatic dysfunction of lumbar region: Secondary | ICD-10-CM | POA: Diagnosis not present

## 2017-09-06 DIAGNOSIS — M9904 Segmental and somatic dysfunction of sacral region: Secondary | ICD-10-CM | POA: Diagnosis not present

## 2017-09-06 DIAGNOSIS — M7918 Myalgia, other site: Secondary | ICD-10-CM | POA: Diagnosis not present

## 2017-09-06 DIAGNOSIS — M9903 Segmental and somatic dysfunction of lumbar region: Secondary | ICD-10-CM | POA: Diagnosis not present

## 2017-09-06 DIAGNOSIS — M545 Low back pain: Secondary | ICD-10-CM | POA: Diagnosis not present

## 2017-09-08 DIAGNOSIS — M7918 Myalgia, other site: Secondary | ICD-10-CM | POA: Diagnosis not present

## 2017-09-08 DIAGNOSIS — M9904 Segmental and somatic dysfunction of sacral region: Secondary | ICD-10-CM | POA: Diagnosis not present

## 2017-09-08 DIAGNOSIS — M9903 Segmental and somatic dysfunction of lumbar region: Secondary | ICD-10-CM | POA: Diagnosis not present

## 2017-09-08 DIAGNOSIS — M545 Low back pain: Secondary | ICD-10-CM | POA: Diagnosis not present

## 2017-09-15 DIAGNOSIS — M9904 Segmental and somatic dysfunction of sacral region: Secondary | ICD-10-CM | POA: Diagnosis not present

## 2017-09-15 DIAGNOSIS — M9903 Segmental and somatic dysfunction of lumbar region: Secondary | ICD-10-CM | POA: Diagnosis not present

## 2017-09-15 DIAGNOSIS — M545 Low back pain: Secondary | ICD-10-CM | POA: Diagnosis not present

## 2017-09-15 DIAGNOSIS — M7918 Myalgia, other site: Secondary | ICD-10-CM | POA: Diagnosis not present

## 2017-09-16 DIAGNOSIS — M7918 Myalgia, other site: Secondary | ICD-10-CM | POA: Diagnosis not present

## 2017-09-16 DIAGNOSIS — M9904 Segmental and somatic dysfunction of sacral region: Secondary | ICD-10-CM | POA: Diagnosis not present

## 2017-09-16 DIAGNOSIS — M9903 Segmental and somatic dysfunction of lumbar region: Secondary | ICD-10-CM | POA: Diagnosis not present

## 2017-09-16 DIAGNOSIS — M545 Low back pain: Secondary | ICD-10-CM | POA: Diagnosis not present

## 2017-09-20 DIAGNOSIS — M545 Low back pain: Secondary | ICD-10-CM | POA: Diagnosis not present

## 2017-09-20 DIAGNOSIS — M7918 Myalgia, other site: Secondary | ICD-10-CM | POA: Diagnosis not present

## 2017-09-20 DIAGNOSIS — M9904 Segmental and somatic dysfunction of sacral region: Secondary | ICD-10-CM | POA: Diagnosis not present

## 2017-09-20 DIAGNOSIS — M9903 Segmental and somatic dysfunction of lumbar region: Secondary | ICD-10-CM | POA: Diagnosis not present

## 2017-09-23 DIAGNOSIS — M9903 Segmental and somatic dysfunction of lumbar region: Secondary | ICD-10-CM | POA: Diagnosis not present

## 2017-09-23 DIAGNOSIS — M7918 Myalgia, other site: Secondary | ICD-10-CM | POA: Diagnosis not present

## 2017-09-23 DIAGNOSIS — M545 Low back pain: Secondary | ICD-10-CM | POA: Diagnosis not present

## 2017-09-23 DIAGNOSIS — M9904 Segmental and somatic dysfunction of sacral region: Secondary | ICD-10-CM | POA: Diagnosis not present

## 2017-09-27 DIAGNOSIS — M7918 Myalgia, other site: Secondary | ICD-10-CM | POA: Diagnosis not present

## 2017-09-27 DIAGNOSIS — M545 Low back pain: Secondary | ICD-10-CM | POA: Diagnosis not present

## 2017-09-27 DIAGNOSIS — M9903 Segmental and somatic dysfunction of lumbar region: Secondary | ICD-10-CM | POA: Diagnosis not present

## 2017-09-27 DIAGNOSIS — M9904 Segmental and somatic dysfunction of sacral region: Secondary | ICD-10-CM | POA: Diagnosis not present

## 2017-09-30 DIAGNOSIS — M9904 Segmental and somatic dysfunction of sacral region: Secondary | ICD-10-CM | POA: Diagnosis not present

## 2017-09-30 DIAGNOSIS — M545 Low back pain: Secondary | ICD-10-CM | POA: Diagnosis not present

## 2017-09-30 DIAGNOSIS — M9903 Segmental and somatic dysfunction of lumbar region: Secondary | ICD-10-CM | POA: Diagnosis not present

## 2017-09-30 DIAGNOSIS — M7918 Myalgia, other site: Secondary | ICD-10-CM | POA: Diagnosis not present

## 2017-10-04 DIAGNOSIS — M9903 Segmental and somatic dysfunction of lumbar region: Secondary | ICD-10-CM | POA: Diagnosis not present

## 2017-10-04 DIAGNOSIS — M545 Low back pain: Secondary | ICD-10-CM | POA: Diagnosis not present

## 2017-10-04 DIAGNOSIS — M7918 Myalgia, other site: Secondary | ICD-10-CM | POA: Diagnosis not present

## 2017-10-04 DIAGNOSIS — M9904 Segmental and somatic dysfunction of sacral region: Secondary | ICD-10-CM | POA: Diagnosis not present

## 2017-10-12 ENCOUNTER — Other Ambulatory Visit: Payer: Self-pay | Admitting: Family Medicine

## 2017-10-12 ENCOUNTER — Other Ambulatory Visit (HOSPITAL_COMMUNITY)
Admission: RE | Admit: 2017-10-12 | Discharge: 2017-10-12 | Disposition: A | Discharge status: Home / Self Care | Payer: BLUE CROSS/BLUE SHIELD | Source: Ambulatory Visit | Attending: Family Medicine | Admitting: Family Medicine

## 2017-10-12 DIAGNOSIS — Z01411 Encounter for gynecological examination (general) (routine) with abnormal findings: Secondary | ICD-10-CM | POA: Insufficient documentation

## 2017-10-12 DIAGNOSIS — Z0000 Encounter for general adult medical examination without abnormal findings: Secondary | ICD-10-CM | POA: Diagnosis not present

## 2017-10-12 DIAGNOSIS — Z13220 Encounter for screening for lipoid disorders: Secondary | ICD-10-CM | POA: Diagnosis not present

## 2017-10-13 DIAGNOSIS — M9904 Segmental and somatic dysfunction of sacral region: Secondary | ICD-10-CM | POA: Diagnosis not present

## 2017-10-13 DIAGNOSIS — M545 Low back pain: Secondary | ICD-10-CM | POA: Diagnosis not present

## 2017-10-13 DIAGNOSIS — M9903 Segmental and somatic dysfunction of lumbar region: Secondary | ICD-10-CM | POA: Diagnosis not present

## 2017-10-13 DIAGNOSIS — M7918 Myalgia, other site: Secondary | ICD-10-CM | POA: Diagnosis not present

## 2017-10-14 DIAGNOSIS — M7918 Myalgia, other site: Secondary | ICD-10-CM | POA: Diagnosis not present

## 2017-10-14 DIAGNOSIS — M9903 Segmental and somatic dysfunction of lumbar region: Secondary | ICD-10-CM | POA: Diagnosis not present

## 2017-10-14 DIAGNOSIS — M545 Low back pain: Secondary | ICD-10-CM | POA: Diagnosis not present

## 2017-10-14 DIAGNOSIS — M9904 Segmental and somatic dysfunction of sacral region: Secondary | ICD-10-CM | POA: Diagnosis not present

## 2017-10-14 LAB — CYTOLOGY - PAP
Diagnosis: NEGATIVE
HPV (WINDOPATH): NOT DETECTED

## 2017-10-18 DIAGNOSIS — M545 Low back pain: Secondary | ICD-10-CM | POA: Diagnosis not present

## 2017-10-18 DIAGNOSIS — M9903 Segmental and somatic dysfunction of lumbar region: Secondary | ICD-10-CM | POA: Diagnosis not present

## 2017-10-18 DIAGNOSIS — M9904 Segmental and somatic dysfunction of sacral region: Secondary | ICD-10-CM | POA: Diagnosis not present

## 2017-10-18 DIAGNOSIS — M7918 Myalgia, other site: Secondary | ICD-10-CM | POA: Diagnosis not present

## 2017-10-21 DIAGNOSIS — M7918 Myalgia, other site: Secondary | ICD-10-CM | POA: Diagnosis not present

## 2017-10-21 DIAGNOSIS — M9903 Segmental and somatic dysfunction of lumbar region: Secondary | ICD-10-CM | POA: Diagnosis not present

## 2017-10-21 DIAGNOSIS — M9904 Segmental and somatic dysfunction of sacral region: Secondary | ICD-10-CM | POA: Diagnosis not present

## 2017-10-21 DIAGNOSIS — M545 Low back pain: Secondary | ICD-10-CM | POA: Diagnosis not present

## 2017-11-12 ENCOUNTER — Encounter (HOSPITAL_COMMUNITY): Payer: Self-pay | Admitting: Emergency Medicine

## 2017-11-12 ENCOUNTER — Ambulatory Visit (HOSPITAL_COMMUNITY)
Admission: EM | Admit: 2017-11-12 | Discharge: 2017-11-12 | Disposition: A | Discharge status: Home / Self Care | Payer: BLUE CROSS/BLUE SHIELD | Attending: Family Medicine | Admitting: Family Medicine

## 2017-11-12 DIAGNOSIS — G43011 Migraine without aura, intractable, with status migrainosus: Secondary | ICD-10-CM | POA: Diagnosis not present

## 2017-11-12 MED ORDER — METOCLOPRAMIDE HCL 5 MG/ML IJ SOLN
INTRAMUSCULAR | Status: AC
  Filled 2017-11-12: qty 2

## 2017-11-12 MED ORDER — KETOROLAC TROMETHAMINE 60 MG/2ML IM SOLN
60.0000 mg | Freq: Once | INTRAMUSCULAR | Status: AC
  Administered 2017-11-12: 60 mg via INTRAMUSCULAR

## 2017-11-12 MED ORDER — DEXAMETHASONE SODIUM PHOSPHATE 10 MG/ML IJ SOLN
INTRAMUSCULAR | Status: AC
  Filled 2017-11-12: qty 1

## 2017-11-12 MED ORDER — KETOROLAC TROMETHAMINE 60 MG/2ML IM SOLN
INTRAMUSCULAR | Status: AC
  Filled 2017-11-12: qty 2

## 2017-11-12 MED ORDER — DEXAMETHASONE SODIUM PHOSPHATE 10 MG/ML IJ SOLN
10.0000 mg | Freq: Once | INTRAMUSCULAR | Status: AC
  Administered 2017-11-12: 10 mg via INTRAMUSCULAR

## 2017-11-12 MED ORDER — METOCLOPRAMIDE HCL 5 MG/ML IJ SOLN
5.0000 mg | Freq: Once | INTRAMUSCULAR | Status: AC
  Administered 2017-11-12: 5 mg via INTRAMUSCULAR

## 2017-11-12 NOTE — ED Provider Notes (Signed)
[facility]    CSN: [id number] Arrival date & time: 11/12/17  1124     History   Chief Complaint Chief Complaint  Patient presents with  . Headache    HPI Janet Jacobs is a 37 y.o. female.   Is a 37 year old female with past medical history of headache.  She reports a frontal headache with nausea that started yesterday evening.  She took ibuprofen last night and this morning with no relief of symptoms.  She describes it as a vice on her head.  She is having photophobia, phonophobia and blurry vision at times.  She has had migraines in the past and usually takes Excedrin for relief.  She denies any fever, chills, body aches, rashes, insect bites.  She denies any weakness, numbness, tingling or slurred speech.  She rates her headache 9 out of 10.   She does not smoke. No hx of CVA. She does have family hx of CVA. Other family hx reviewed.  She is not breastfeeding an denies pregnancy.   ROS per HPI      Past Medical History:  Diagnosis Date  . Chronic hepatitis B without delta agent without cirrhosis ([place]) 09/04/2015  . Complication of anesthesia    HAS RESISTANCE TO LIDOCAINE SETTING UP AT DENTIST AND PAIN CONTROL.  wOKE UP DURING GENERAL ANESTHESIA FOR WISDOME TEETH  . Medical history non-contributory   . No pertinent past medical history     Patient Active Problem List   Diagnosis Date Noted  . Chronic hepatitis B without delta agent without cirrhosis ([place]) 09/04/2015  . Headache 09/03/2015  . Elevated blood pressure 09/03/2015  . Delivered by cesarean section 08/27/2015  . Status post primary low transverse cesarean section 08/27/2015  . Latex allergy 08/23/2015  . BMI 45.0-49.9, adult ([place]) 08/23/2015  . AMA (advanced maternal age) multigravida 35+ 08/23/2015  . History of termination of pregnancy x 2 08/23/2015  . Cystic hygroma 01/04/2013    Past Surgical History:  Procedure Laterality Date  . CESAREAN SECTION N/A 08/27/2015   Procedure:  CESAREAN SECTION;  Surgeon: Waymon Amato, MD;  Location: [facility];  Service: Obstetrics;  Laterality: N/A;  . NO PAST SURGERIES    . WISDOM TOOTH EXTRACTION      OB History    Gravida  5   Para  3   Term  3   Preterm  0   AB  2   Living  3     SAB  0   TAB  2   Ectopic  0   Multiple  0   Live Births  3            Home Medications    Prior to Admission medications   Medication Sig Start Date End Date Taking? Authorizing Provider  docusate sodium (COLACE) 100 MG capsule Take 1 capsule (100 mg total) by mouth 2 (two) times daily. 08/30/15   Ena Dawley, MD  ferrous sulfate 325 (65 FE) MG tablet Take 1 tablet (325 mg total) by mouth 2 (two) times daily. Take with orange juice for better absorption. 09/03/15   Farrel Gordon, CNM  ibuprofen (ADVIL,MOTRIN) 600 MG tablet Take 1 tablet (600 mg total) by mouth every 6 (six) hours as needed for mild pain. 08/29/15   Delsa Bern, MD  lansoprazole (PREVACID) 30 MG capsule Take 30 mg by mouth at bedtime.    [provider]  oxyCODONE-acetaminophen (PERCOCET/ROXICET) 5-325 MG tablet Take 1 tablet by mouth every 4 (  four) hours as needed (pain scale 4-7). 08/29/15   Delsa Bern, MD  Prenatal Vit-Fe Fumarate-FA (PRENATAL MULTIVITAMIN) TABS Take 1 tablet by mouth at bedtime.     [provider]    Family History Family History  Problem Relation Age of Onset  . Hypertension Mother   . Miscarriages / [place] Mother   . Arthritis Father   . Diabetes Father   . Hypertension Father   . Alcohol abuse Neg Hx   . Asthma Neg Hx   . Birth defects Neg Hx   . Cancer Neg Hx   . COPD Neg Hx   . Depression Neg Hx   . Drug abuse Neg Hx   . Early death Neg Hx   . Hearing loss Neg Hx   . Heart disease Neg Hx   . Hyperlipidemia Neg Hx   . Kidney disease Neg Hx   . Learning disabilities Neg Hx   . Mental illness Neg Hx   . Mental retardation Neg Hx   . Stroke Neg Hx   . Vision loss Neg Hx      Social History Social History   Tobacco Use  . Smoking status: Never Smoker  . Smokeless tobacco: Never Used  Substance Use Topics  . Alcohol use: No  . Drug use: No     Allergies   Latex   Review of Systems Review of Systems   Physical Exam Triage Vital Signs ED Triage Vitals [11/12/17 1141]  Enc Vitals Group     BP (!) 150/90     Pulse Rate 81     Resp 18     Temp 98.2 F (36.8 C)     Temp Source Oral     SpO2 98 %     Weight      Height      Head Circumference      Peak Flow      Pain Score      Pain Loc      Pain Edu?      Excl. in [place]?    No data found.  Updated Vital Signs BP (!) 150/90 (BP Location: Left Arm)   Pulse 81   Temp 98.2 F (36.8 C) (Oral)   Resp 18   SpO2 98%   Visual Acuity Right Eye Distance:   Left Eye Distance:   Bilateral Distance:    Right Eye Near:   Left Eye Near:    Bilateral Near:     Physical Exam  Constitutional: She is oriented to person, place, and time. She appears well-developed and well-nourished. She appears ill.  Patient squinting eyes due to pain.   HENT:  Head: Normocephalic and atraumatic.  Mouth/Throat: Oropharynx is clear and moist.  Eyes: Pupils are equal, round, and reactive to light. EOM are normal. Right eye exhibits normal extraocular motion and no nystagmus. Left eye exhibits normal extraocular motion and no nystagmus.  Neck: Normal range of motion. Neck supple.  No lymphadenopathy or neck tenderness  Cardiovascular: Normal rate, regular rhythm and normal heart sounds.  Pulmonary/Chest: Effort normal and breath sounds normal.  Musculoskeletal: Normal range of motion. She exhibits no edema or tenderness.  Neurological: She is alert and oriented to person, place, and time. She has normal strength. She is not disoriented. No cranial nerve deficit or sensory deficit.  No facial palsy, slurred speech, extremity weakness.  Cranial nerves intact.  Strength 5/5.   Skin: Skin is warm and dry.  Capillary refill takes less than  2 seconds. No rash noted.  Psychiatric: She has a normal mood and affect.  Nursing note and vitals reviewed.    UC Treatments / Results  Labs (all labs ordered are listed, but only abnormal results are displayed) Labs Reviewed - No data to display  EKG None  Radiology No results found.  Procedures Procedures (including critical care time)  Medications Ordered in UC Medications  ketorolac (TORADOL) injection 60 mg (60 mg Intramuscular Given 11/12/17 1220)  metoCLOPramide (REGLAN) injection 5 mg (5 mg Intramuscular Given 11/12/17 1221)  dexamethasone (DECADRON) injection 10 mg (10 mg Intramuscular Given 11/12/17 1221)    Initial Impression / Assessment and Plan / UC Course  I have reviewed the triage vital signs and the nursing notes.  Pertinent labs & imaging results that were available during my care of the patient were reviewed by me and considered in my medical decision making (see chart for details).    Patient appears to be in a lot of pain.  No concerning signs on exam for CVA.  No focal neuro deficits noted.   Will go ahead and treat pt for migraine based on pain level and non successful treatment with ibuprofen.   Instructed to follow up for worsening or concerning symptoms or if symptoms don't improve in the next 24 hours.  Final Clinical Impressions(s) / UC Diagnoses   Final diagnoses:  Intractable migraine without aura and with status migrainosus     Discharge Instructions     It was nice meeting you!!  We treated you for a migraine.  I hope this helps.  If you develop worsening symptoms please follow up or go to the ER.     ED Prescriptions    None     Controlled Substance Prescriptions [place] Controlled Substance Registry consulted? Not Applicable   Orvan July, NP 11/12/17 1247

## 2017-11-12 NOTE — Discharge Instructions (Addendum)
It was nice meeting you!!  We treated you for a migraine.  I hope this helps.  If you develop worsening symptoms please follow up or go to the ER.

## 2017-11-12 NOTE — ED Triage Notes (Signed)
Pt here for HA in frontal area starting last night with nausea

## 2017-11-30 DIAGNOSIS — Z23 Encounter for immunization: Secondary | ICD-10-CM | POA: Diagnosis not present

## 2017-11-30 DIAGNOSIS — I10 Essential (primary) hypertension: Secondary | ICD-10-CM | POA: Diagnosis not present

## 2018-02-23 DIAGNOSIS — I10 Essential (primary) hypertension: Secondary | ICD-10-CM | POA: Diagnosis not present

## 2018-08-24 DIAGNOSIS — I10 Essential (primary) hypertension: Secondary | ICD-10-CM | POA: Diagnosis not present

## 2018-08-24 DIAGNOSIS — L659 Nonscarring hair loss, unspecified: Secondary | ICD-10-CM | POA: Diagnosis not present

## 2018-09-09 DIAGNOSIS — I10 Essential (primary) hypertension: Secondary | ICD-10-CM | POA: Diagnosis not present

## 2018-09-15 DIAGNOSIS — D224 Melanocytic nevi of scalp and neck: Secondary | ICD-10-CM | POA: Diagnosis not present

## 2018-09-15 DIAGNOSIS — L218 Other seborrheic dermatitis: Secondary | ICD-10-CM | POA: Diagnosis not present

## 2018-10-14 DIAGNOSIS — R140 Abdominal distension (gaseous): Secondary | ICD-10-CM | POA: Diagnosis not present

## 2018-10-14 DIAGNOSIS — Z0000 Encounter for general adult medical examination without abnormal findings: Secondary | ICD-10-CM | POA: Diagnosis not present

## 2018-11-11 DIAGNOSIS — Z7189 Other specified counseling: Secondary | ICD-10-CM | POA: Diagnosis not present

## 2018-11-11 DIAGNOSIS — Z20828 Contact with and (suspected) exposure to other viral communicable diseases: Secondary | ICD-10-CM | POA: Diagnosis not present

## 2018-11-16 DIAGNOSIS — R1084 Generalized abdominal pain: Secondary | ICD-10-CM | POA: Diagnosis not present

## 2018-12-06 ENCOUNTER — Other Ambulatory Visit: Payer: Self-pay | Admitting: Gastroenterology

## 2018-12-06 DIAGNOSIS — R109 Unspecified abdominal pain: Secondary | ICD-10-CM

## 2018-12-06 DIAGNOSIS — R140 Abdominal distension (gaseous): Secondary | ICD-10-CM

## 2018-12-27 DIAGNOSIS — F4323 Adjustment disorder with mixed anxiety and depressed mood: Secondary | ICD-10-CM | POA: Diagnosis not present

## 2019-01-03 DIAGNOSIS — F4323 Adjustment disorder with mixed anxiety and depressed mood: Secondary | ICD-10-CM | POA: Diagnosis not present

## 2019-01-10 DIAGNOSIS — F4323 Adjustment disorder with mixed anxiety and depressed mood: Secondary | ICD-10-CM | POA: Diagnosis not present

## 2019-01-17 DIAGNOSIS — F4323 Adjustment disorder with mixed anxiety and depressed mood: Secondary | ICD-10-CM | POA: Diagnosis not present

## 2019-01-26 DIAGNOSIS — F4323 Adjustment disorder with mixed anxiety and depressed mood: Secondary | ICD-10-CM | POA: Diagnosis not present

## 2019-01-31 DIAGNOSIS — F4323 Adjustment disorder with mixed anxiety and depressed mood: Secondary | ICD-10-CM | POA: Diagnosis not present

## 2019-02-14 DIAGNOSIS — F4323 Adjustment disorder with mixed anxiety and depressed mood: Secondary | ICD-10-CM | POA: Diagnosis not present

## 2019-02-22 DIAGNOSIS — F4323 Adjustment disorder with mixed anxiety and depressed mood: Secondary | ICD-10-CM | POA: Diagnosis not present

## 2019-03-07 DIAGNOSIS — F4323 Adjustment disorder with mixed anxiety and depressed mood: Secondary | ICD-10-CM | POA: Diagnosis not present

## 2019-03-14 DIAGNOSIS — F4323 Adjustment disorder with mixed anxiety and depressed mood: Secondary | ICD-10-CM | POA: Diagnosis not present

## 2019-03-21 DIAGNOSIS — F4323 Adjustment disorder with mixed anxiety and depressed mood: Secondary | ICD-10-CM | POA: Diagnosis not present

## 2019-03-24 DIAGNOSIS — F4323 Adjustment disorder with mixed anxiety and depressed mood: Secondary | ICD-10-CM | POA: Diagnosis not present

## 2019-03-29 DIAGNOSIS — F4323 Adjustment disorder with mixed anxiety and depressed mood: Secondary | ICD-10-CM | POA: Diagnosis not present

## 2019-04-11 DIAGNOSIS — F4323 Adjustment disorder with mixed anxiety and depressed mood: Secondary | ICD-10-CM | POA: Diagnosis not present

## 2019-04-18 DIAGNOSIS — F4323 Adjustment disorder with mixed anxiety and depressed mood: Secondary | ICD-10-CM | POA: Diagnosis not present

## 2019-04-25 DIAGNOSIS — F4323 Adjustment disorder with mixed anxiety and depressed mood: Secondary | ICD-10-CM | POA: Diagnosis not present

## 2019-05-02 DIAGNOSIS — F4323 Adjustment disorder with mixed anxiety and depressed mood: Secondary | ICD-10-CM | POA: Diagnosis not present

## 2019-05-09 DIAGNOSIS — F4323 Adjustment disorder with mixed anxiety and depressed mood: Secondary | ICD-10-CM | POA: Diagnosis not present

## 2019-05-23 DIAGNOSIS — Z7189 Other specified counseling: Secondary | ICD-10-CM | POA: Diagnosis not present

## 2019-05-23 DIAGNOSIS — Z20828 Contact with and (suspected) exposure to other viral communicable diseases: Secondary | ICD-10-CM | POA: Diagnosis not present

## 2019-05-23 DIAGNOSIS — F4323 Adjustment disorder with mixed anxiety and depressed mood: Secondary | ICD-10-CM | POA: Diagnosis not present

## 2019-05-23 DIAGNOSIS — Z03818 Encounter for observation for suspected exposure to other biological agents ruled out: Secondary | ICD-10-CM | POA: Diagnosis not present

## 2019-05-30 DIAGNOSIS — F4323 Adjustment disorder with mixed anxiety and depressed mood: Secondary | ICD-10-CM | POA: Diagnosis not present

## 2019-06-01 ENCOUNTER — Ambulatory Visit: Payer: Medicaid Other | Attending: Family

## 2019-06-01 DIAGNOSIS — Z23 Encounter for immunization: Secondary | ICD-10-CM

## 2019-06-01 NOTE — Progress Notes (Signed)
   Covid-19 Vaccination Clinic  Name:  Janet Jacobs    MRN: [id number] DOB: May 24, 1980  06/01/2019  Ms. [name] was observed post Covid-19 immunization for 15 minutes without incidence. She was provided with Vaccine Information Sheet and instruction to access the V-Safe system.   Ms. [name] was instructed to call 911 with any severe reactions post vaccine: Marland Kitchen Difficulty breathing  . Swelling of your face and throat  . A fast heartbeat  . A bad rash all over your body  . Dizziness and weakness    Immunizations Administered    Name Date Dose VIS Date Route   Moderna COVID-19 Vaccine 06/01/2019 11:08 AM 0.5 mL 03/07/2019 Intramuscular   Manufacturer: Moderna   Lot: [id number]   [id number]

## 2019-06-14 DIAGNOSIS — F4323 Adjustment disorder with mixed anxiety and depressed mood: Secondary | ICD-10-CM | POA: Diagnosis not present

## 2019-06-20 DIAGNOSIS — F4323 Adjustment disorder with mixed anxiety and depressed mood: Secondary | ICD-10-CM | POA: Diagnosis not present

## 2019-06-27 DIAGNOSIS — F4323 Adjustment disorder with mixed anxiety and depressed mood: Secondary | ICD-10-CM | POA: Diagnosis not present

## 2019-07-04 ENCOUNTER — Ambulatory Visit: Payer: Medicaid Other | Attending: Family

## 2019-07-04 DIAGNOSIS — F4323 Adjustment disorder with mixed anxiety and depressed mood: Secondary | ICD-10-CM | POA: Diagnosis not present

## 2019-07-04 DIAGNOSIS — Z23 Encounter for immunization: Secondary | ICD-10-CM

## 2019-07-04 NOTE — Progress Notes (Signed)
   Covid-19 Vaccination Clinic  Name:  Janet Jacobs    MRN: [id number] DOB: 30-Nov-1980  07/04/2019  [name] was observed post Covid-19 immunization for 15 minutes without incident. She was provided with Vaccine Information Sheet and instruction to access the V-Safe system.   [name] was instructed to call 911 with any severe reactions post vaccine: Marland Kitchen Difficulty breathing  . Swelling of face and throat  . A fast heartbeat  . A bad rash all over body  . Dizziness and weakness   Immunizations Administered    Name Date Dose VIS Date Route   Moderna COVID-19 Vaccine 07/04/2019 10:12 AM 0.5 mL 03/07/2019 Intramuscular   Manufacturer: Moderna   LotFP:[id number]   [place]BE:[id number]

## 2019-07-11 DIAGNOSIS — F4323 Adjustment disorder with mixed anxiety and depressed mood: Secondary | ICD-10-CM | POA: Diagnosis not present

## 2019-07-18 DIAGNOSIS — F4323 Adjustment disorder with mixed anxiety and depressed mood: Secondary | ICD-10-CM | POA: Diagnosis not present

## 2019-07-25 DIAGNOSIS — F4323 Adjustment disorder with mixed anxiety and depressed mood: Secondary | ICD-10-CM | POA: Diagnosis not present

## 2019-08-01 DIAGNOSIS — F4323 Adjustment disorder with mixed anxiety and depressed mood: Secondary | ICD-10-CM | POA: Diagnosis not present

## 2019-08-15 DIAGNOSIS — F4323 Adjustment disorder with mixed anxiety and depressed mood: Secondary | ICD-10-CM | POA: Diagnosis not present

## 2019-08-30 DIAGNOSIS — F4323 Adjustment disorder with mixed anxiety and depressed mood: Secondary | ICD-10-CM | POA: Diagnosis not present

## 2019-09-12 DIAGNOSIS — F4322 Adjustment disorder with anxiety: Secondary | ICD-10-CM | POA: Diagnosis not present

## 2019-09-20 DIAGNOSIS — Z630 Problems in relationship with spouse or partner: Secondary | ICD-10-CM | POA: Diagnosis not present

## 2019-09-20 DIAGNOSIS — F4322 Adjustment disorder with anxiety: Secondary | ICD-10-CM | POA: Diagnosis not present

## 2019-10-10 DIAGNOSIS — Z630 Problems in relationship with spouse or partner: Secondary | ICD-10-CM | POA: Diagnosis not present

## 2019-10-10 DIAGNOSIS — F4322 Adjustment disorder with anxiety: Secondary | ICD-10-CM | POA: Diagnosis not present

## 2019-11-07 DIAGNOSIS — Z630 Problems in relationship with spouse or partner: Secondary | ICD-10-CM | POA: Diagnosis not present

## 2019-11-07 DIAGNOSIS — F4322 Adjustment disorder with anxiety: Secondary | ICD-10-CM | POA: Diagnosis not present

## 2019-11-17 DIAGNOSIS — Z03818 Encounter for observation for suspected exposure to other biological agents ruled out: Secondary | ICD-10-CM | POA: Diagnosis not present

## 2019-11-21 DIAGNOSIS — Z03818 Encounter for observation for suspected exposure to other biological agents ruled out: Secondary | ICD-10-CM | POA: Diagnosis not present

## 2019-11-22 DIAGNOSIS — F4322 Adjustment disorder with anxiety: Secondary | ICD-10-CM | POA: Diagnosis not present

## 2019-11-22 DIAGNOSIS — Z630 Problems in relationship with spouse or partner: Secondary | ICD-10-CM | POA: Diagnosis not present

## 2019-11-28 DIAGNOSIS — F4322 Adjustment disorder with anxiety: Secondary | ICD-10-CM | POA: Diagnosis not present

## 2019-11-28 DIAGNOSIS — Z630 Problems in relationship with spouse or partner: Secondary | ICD-10-CM | POA: Diagnosis not present

## 2019-12-05 DIAGNOSIS — F4322 Adjustment disorder with anxiety: Secondary | ICD-10-CM | POA: Diagnosis not present

## 2019-12-12 DIAGNOSIS — F4322 Adjustment disorder with anxiety: Secondary | ICD-10-CM | POA: Diagnosis not present

## 2019-12-12 DIAGNOSIS — Z630 Problems in relationship with spouse or partner: Secondary | ICD-10-CM | POA: Diagnosis not present

## 2019-12-19 DIAGNOSIS — Z03818 Encounter for observation for suspected exposure to other biological agents ruled out: Secondary | ICD-10-CM | POA: Diagnosis not present

## 2019-12-26 DIAGNOSIS — F4322 Adjustment disorder with anxiety: Secondary | ICD-10-CM | POA: Diagnosis not present

## 2019-12-26 DIAGNOSIS — Z630 Problems in relationship with spouse or partner: Secondary | ICD-10-CM | POA: Diagnosis not present

## 2020-01-02 DIAGNOSIS — F4322 Adjustment disorder with anxiety: Secondary | ICD-10-CM | POA: Diagnosis not present

## 2020-01-02 DIAGNOSIS — Z630 Problems in relationship with spouse or partner: Secondary | ICD-10-CM | POA: Diagnosis not present

## 2020-01-16 DIAGNOSIS — Z630 Problems in relationship with spouse or partner: Secondary | ICD-10-CM | POA: Diagnosis not present

## 2020-01-16 DIAGNOSIS — F4322 Adjustment disorder with anxiety: Secondary | ICD-10-CM | POA: Diagnosis not present

## 2020-02-13 DIAGNOSIS — Z630 Problems in relationship with spouse or partner: Secondary | ICD-10-CM | POA: Diagnosis not present

## 2020-02-13 DIAGNOSIS — F4322 Adjustment disorder with anxiety: Secondary | ICD-10-CM | POA: Diagnosis not present

## 2020-02-17 DIAGNOSIS — Z20828 Contact with and (suspected) exposure to other viral communicable diseases: Secondary | ICD-10-CM | POA: Diagnosis not present

## 2020-02-20 DIAGNOSIS — Z630 Problems in relationship with spouse or partner: Secondary | ICD-10-CM | POA: Diagnosis not present

## 2020-02-20 DIAGNOSIS — F4322 Adjustment disorder with anxiety: Secondary | ICD-10-CM | POA: Diagnosis not present

## 2020-03-01 DIAGNOSIS — Z20828 Contact with and (suspected) exposure to other viral communicable diseases: Secondary | ICD-10-CM | POA: Diagnosis not present

## 2020-03-05 DIAGNOSIS — Z630 Problems in relationship with spouse or partner: Secondary | ICD-10-CM | POA: Diagnosis not present

## 2020-03-05 DIAGNOSIS — F4322 Adjustment disorder with anxiety: Secondary | ICD-10-CM | POA: Diagnosis not present

## 2020-03-12 DIAGNOSIS — F4322 Adjustment disorder with anxiety: Secondary | ICD-10-CM | POA: Diagnosis not present

## 2020-03-12 DIAGNOSIS — Z630 Problems in relationship with spouse or partner: Secondary | ICD-10-CM | POA: Diagnosis not present

## 2020-03-19 DIAGNOSIS — Z630 Problems in relationship with spouse or partner: Secondary | ICD-10-CM | POA: Diagnosis not present

## 2020-03-19 DIAGNOSIS — F4322 Adjustment disorder with anxiety: Secondary | ICD-10-CM | POA: Diagnosis not present

## 2020-03-20 DIAGNOSIS — I10 Essential (primary) hypertension: Secondary | ICD-10-CM | POA: Diagnosis not present

## 2020-03-21 DIAGNOSIS — I10 Essential (primary) hypertension: Secondary | ICD-10-CM | POA: Diagnosis not present

## 2020-04-09 DIAGNOSIS — F4322 Adjustment disorder with anxiety: Secondary | ICD-10-CM | POA: Diagnosis not present

## 2020-04-09 DIAGNOSIS — Z630 Problems in relationship with spouse or partner: Secondary | ICD-10-CM | POA: Diagnosis not present

## 2020-04-16 DIAGNOSIS — Z630 Problems in relationship with spouse or partner: Secondary | ICD-10-CM | POA: Diagnosis not present

## 2020-04-16 DIAGNOSIS — F4322 Adjustment disorder with anxiety: Secondary | ICD-10-CM | POA: Diagnosis not present

## 2020-04-19 DIAGNOSIS — Z23 Encounter for immunization: Secondary | ICD-10-CM | POA: Diagnosis not present

## 2020-04-22 DIAGNOSIS — F4322 Adjustment disorder with anxiety: Secondary | ICD-10-CM | POA: Diagnosis not present

## 2020-04-22 DIAGNOSIS — Z630 Problems in relationship with spouse or partner: Secondary | ICD-10-CM | POA: Diagnosis not present

## 2020-04-30 DIAGNOSIS — Z630 Problems in relationship with spouse or partner: Secondary | ICD-10-CM | POA: Diagnosis not present

## 2020-04-30 DIAGNOSIS — F4322 Adjustment disorder with anxiety: Secondary | ICD-10-CM | POA: Diagnosis not present

## 2020-05-07 DIAGNOSIS — F4322 Adjustment disorder with anxiety: Secondary | ICD-10-CM | POA: Diagnosis not present

## 2020-05-07 DIAGNOSIS — Z630 Problems in relationship with spouse or partner: Secondary | ICD-10-CM | POA: Diagnosis not present

## 2020-05-21 DIAGNOSIS — Z630 Problems in relationship with spouse or partner: Secondary | ICD-10-CM | POA: Diagnosis not present

## 2020-05-21 DIAGNOSIS — F4322 Adjustment disorder with anxiety: Secondary | ICD-10-CM | POA: Diagnosis not present

## 2020-05-28 DIAGNOSIS — Z630 Problems in relationship with spouse or partner: Secondary | ICD-10-CM | POA: Diagnosis not present

## 2020-05-28 DIAGNOSIS — F4322 Adjustment disorder with anxiety: Secondary | ICD-10-CM | POA: Diagnosis not present

## 2020-06-04 DIAGNOSIS — F4322 Adjustment disorder with anxiety: Secondary | ICD-10-CM | POA: Diagnosis not present

## 2020-06-04 DIAGNOSIS — Z630 Problems in relationship with spouse or partner: Secondary | ICD-10-CM | POA: Diagnosis not present

## 2020-06-11 DIAGNOSIS — Z630 Problems in relationship with spouse or partner: Secondary | ICD-10-CM | POA: Diagnosis not present

## 2020-06-11 DIAGNOSIS — F4322 Adjustment disorder with anxiety: Secondary | ICD-10-CM | POA: Diagnosis not present

## 2020-06-20 DIAGNOSIS — F4322 Adjustment disorder with anxiety: Secondary | ICD-10-CM | POA: Diagnosis not present

## 2020-06-25 DIAGNOSIS — F4322 Adjustment disorder with anxiety: Secondary | ICD-10-CM | POA: Diagnosis not present

## 2020-07-02 DIAGNOSIS — F4322 Adjustment disorder with anxiety: Secondary | ICD-10-CM | POA: Diagnosis not present

## 2020-07-02 DIAGNOSIS — Z630 Problems in relationship with spouse or partner: Secondary | ICD-10-CM | POA: Diagnosis not present

## 2020-07-16 DIAGNOSIS — F4322 Adjustment disorder with anxiety: Secondary | ICD-10-CM | POA: Diagnosis not present

## 2020-07-16 DIAGNOSIS — Z03818 Encounter for observation for suspected exposure to other biological agents ruled out: Secondary | ICD-10-CM | POA: Diagnosis not present

## 2020-07-23 DIAGNOSIS — F4322 Adjustment disorder with anxiety: Secondary | ICD-10-CM | POA: Diagnosis not present

## 2020-07-23 DIAGNOSIS — Z630 Problems in relationship with spouse or partner: Secondary | ICD-10-CM | POA: Diagnosis not present

## 2020-08-06 DIAGNOSIS — F4322 Adjustment disorder with anxiety: Secondary | ICD-10-CM | POA: Diagnosis not present

## 2020-08-06 DIAGNOSIS — Z630 Problems in relationship with spouse or partner: Secondary | ICD-10-CM | POA: Diagnosis not present

## 2020-08-14 DIAGNOSIS — F4322 Adjustment disorder with anxiety: Secondary | ICD-10-CM | POA: Diagnosis not present

## 2020-08-20 DIAGNOSIS — Z630 Problems in relationship with spouse or partner: Secondary | ICD-10-CM | POA: Diagnosis not present

## 2020-08-20 DIAGNOSIS — F4322 Adjustment disorder with anxiety: Secondary | ICD-10-CM | POA: Diagnosis not present

## 2020-08-28 DIAGNOSIS — F4322 Adjustment disorder with anxiety: Secondary | ICD-10-CM | POA: Diagnosis not present

## 2020-09-03 DIAGNOSIS — Z630 Problems in relationship with spouse or partner: Secondary | ICD-10-CM | POA: Diagnosis not present

## 2020-09-03 DIAGNOSIS — F4322 Adjustment disorder with anxiety: Secondary | ICD-10-CM | POA: Diagnosis not present

## 2020-09-10 DIAGNOSIS — F4322 Adjustment disorder with anxiety: Secondary | ICD-10-CM | POA: Diagnosis not present

## 2020-10-01 DIAGNOSIS — Z630 Problems in relationship with spouse or partner: Secondary | ICD-10-CM | POA: Diagnosis not present

## 2020-10-01 DIAGNOSIS — F4322 Adjustment disorder with anxiety: Secondary | ICD-10-CM | POA: Diagnosis not present

## 2020-10-08 DIAGNOSIS — F4322 Adjustment disorder with anxiety: Secondary | ICD-10-CM | POA: Diagnosis not present

## 2020-10-18 DIAGNOSIS — Z630 Problems in relationship with spouse or partner: Secondary | ICD-10-CM | POA: Diagnosis not present

## 2020-10-18 DIAGNOSIS — F4322 Adjustment disorder with anxiety: Secondary | ICD-10-CM | POA: Diagnosis not present

## 2020-10-22 DIAGNOSIS — F4322 Adjustment disorder with anxiety: Secondary | ICD-10-CM | POA: Diagnosis not present

## 2020-10-29 DIAGNOSIS — Z630 Problems in relationship with spouse or partner: Secondary | ICD-10-CM | POA: Diagnosis not present

## 2020-10-29 DIAGNOSIS — F4322 Adjustment disorder with anxiety: Secondary | ICD-10-CM | POA: Diagnosis not present

## 2020-11-19 DIAGNOSIS — F4322 Adjustment disorder with anxiety: Secondary | ICD-10-CM | POA: Diagnosis not present

## 2020-12-03 DIAGNOSIS — F4322 Adjustment disorder with anxiety: Secondary | ICD-10-CM | POA: Diagnosis not present

## 2020-12-13 ENCOUNTER — Other Ambulatory Visit (HOSPITAL_COMMUNITY)
Admission: RE | Admit: 2020-12-13 | Discharge: 2020-12-13 | Disposition: A | Discharge status: Home / Self Care | Payer: BC Managed Care – PPO | Source: Ambulatory Visit | Attending: Family Medicine | Admitting: Family Medicine

## 2020-12-13 ENCOUNTER — Other Ambulatory Visit: Payer: Self-pay | Admitting: Family Medicine

## 2020-12-13 DIAGNOSIS — R1032 Left lower quadrant pain: Secondary | ICD-10-CM | POA: Diagnosis not present

## 2020-12-13 DIAGNOSIS — I10 Essential (primary) hypertension: Secondary | ICD-10-CM | POA: Diagnosis not present

## 2020-12-13 DIAGNOSIS — Z0000 Encounter for general adult medical examination without abnormal findings: Secondary | ICD-10-CM | POA: Insufficient documentation

## 2020-12-13 DIAGNOSIS — L918 Other hypertrophic disorders of the skin: Secondary | ICD-10-CM | POA: Diagnosis not present

## 2020-12-13 DIAGNOSIS — Z13220 Encounter for screening for lipoid disorders: Secondary | ICD-10-CM | POA: Diagnosis not present

## 2020-12-16 ENCOUNTER — Other Ambulatory Visit: Payer: Self-pay | Admitting: Family Medicine

## 2020-12-16 DIAGNOSIS — R1032 Left lower quadrant pain: Secondary | ICD-10-CM

## 2020-12-17 DIAGNOSIS — F4322 Adjustment disorder with anxiety: Secondary | ICD-10-CM | POA: Diagnosis not present

## 2020-12-18 LAB — CYTOLOGY - PAP
Comment: NEGATIVE
Diagnosis: NEGATIVE
High risk HPV: NEGATIVE

## 2020-12-19 ENCOUNTER — Ambulatory Visit
Admission: RE | Admit: 2020-12-19 | Discharge: 2020-12-19 | Disposition: A | Discharge status: Home / Self Care | Payer: Medicaid Other | Source: Ambulatory Visit | Attending: Family Medicine | Admitting: Family Medicine

## 2020-12-19 DIAGNOSIS — R1032 Left lower quadrant pain: Secondary | ICD-10-CM

## 2020-12-19 DIAGNOSIS — R102 Pelvic and perineal pain: Secondary | ICD-10-CM | POA: Diagnosis not present

## 2020-12-20 ENCOUNTER — Other Ambulatory Visit: Payer: Self-pay | Admitting: Family Medicine

## 2020-12-20 DIAGNOSIS — Z1231 Encounter for screening mammogram for malignant neoplasm of breast: Secondary | ICD-10-CM

## 2020-12-30 ENCOUNTER — Ambulatory Visit: Payer: BC Managed Care – PPO

## 2020-12-30 ENCOUNTER — Other Ambulatory Visit: Payer: Self-pay

## 2020-12-31 DIAGNOSIS — F4322 Adjustment disorder with anxiety: Secondary | ICD-10-CM | POA: Diagnosis not present

## 2021-01-14 DIAGNOSIS — F4322 Adjustment disorder with anxiety: Secondary | ICD-10-CM | POA: Diagnosis not present

## 2021-01-17 ENCOUNTER — Ambulatory Visit: Payer: BC Managed Care – PPO

## 2021-02-06 DIAGNOSIS — F4322 Adjustment disorder with anxiety: Secondary | ICD-10-CM | POA: Diagnosis not present

## 2021-02-06 DIAGNOSIS — Z630 Problems in relationship with spouse or partner: Secondary | ICD-10-CM | POA: Diagnosis not present

## 2021-02-13 ENCOUNTER — Other Ambulatory Visit: Payer: Self-pay

## 2021-02-13 ENCOUNTER — Ambulatory Visit
Admission: RE | Admit: 2021-02-13 | Discharge: 2021-02-13 | Disposition: A | Discharge status: Home / Self Care | Payer: BC Managed Care – PPO | Source: Ambulatory Visit | Attending: Family Medicine | Admitting: Family Medicine

## 2021-02-13 DIAGNOSIS — Z1231 Encounter for screening mammogram for malignant neoplasm of breast: Secondary | ICD-10-CM

## 2021-02-20 DIAGNOSIS — Z630 Problems in relationship with spouse or partner: Secondary | ICD-10-CM | POA: Diagnosis not present

## 2021-02-20 DIAGNOSIS — F4322 Adjustment disorder with anxiety: Secondary | ICD-10-CM | POA: Diagnosis not present

## 2021-02-25 DIAGNOSIS — F4322 Adjustment disorder with anxiety: Secondary | ICD-10-CM | POA: Diagnosis not present

## 2021-03-08 DIAGNOSIS — Z630 Problems in relationship with spouse or partner: Secondary | ICD-10-CM | POA: Diagnosis not present

## 2021-03-08 DIAGNOSIS — F4322 Adjustment disorder with anxiety: Secondary | ICD-10-CM | POA: Diagnosis not present

## 2021-03-10 ENCOUNTER — Other Ambulatory Visit: Payer: Self-pay

## 2021-03-10 ENCOUNTER — Ambulatory Visit (INDEPENDENT_AMBULATORY_CARE_PROVIDER_SITE_OTHER): Payer: BC Managed Care – PPO | Admitting: *Deleted

## 2021-03-10 VITALS — BP 130/81 | HR 82 | Temp 98.1°F | Ht 64.0 in | Wt 299.2 lb

## 2021-03-10 DIAGNOSIS — Z3201 Encounter for pregnancy test, result positive: Secondary | ICD-10-CM | POA: Diagnosis not present

## 2021-03-10 DIAGNOSIS — O0990 Supervision of high risk pregnancy, unspecified, unspecified trimester: Secondary | ICD-10-CM | POA: Diagnosis not present

## 2021-03-10 LAB — POCT URINE PREGNANCY: Preg Test, Ur: POSITIVE — AB

## 2021-03-10 NOTE — Progress Notes (Addendum)
    I discussed the limitations, risks, security and privacy concerns of performing an evaluation and management service by telephone and the availability of in person appointments. I also discussed with the patient that there may be a patient responsible charge related to this service. The patient expressed understanding and agreed to proceed.   Location: [facility] Patient: clinic with Husband Provider: clinic Interpreter used: no professional interpreter  PRENATAL INTAKE SUMMARY  [name] presents today New OB Nurse Interview.  OB History     Gravida  6   Para  3   Term  3   Preterm  0   AB  2   Living  3      SAB  0   IAB  2   Ectopic  0   Multiple  0   Live Births  3          I have reviewed the patient's medical, obstetrical, social, and family histories, medications, and available lab results.  SUBJECTIVE She has no unusual complaints.  [id number] on amlodipine. Patient reported she did not take medication this morning before appointment. Today's Vitals   03/10/21 0915  BP: (!) 145/94  Pulse: 85  Temp: 98.1 F (36.7 C)  TempSrc: Oral  Weight: 299 lb 3.2 oz (135.7 kg)  Height: 5\' 4"  (1.626 m)   Body mass index is 51.36 kg/m.   Repeat BP taken at home: 130/81, heart rate 82.  OBJECTIVE Initial Nurse interview for history/labs (New OB)  last menstrual period date: 01/10/2021 EDD: 10/17/2021 by LMP  GA: [redacted]w[redacted]d [id number] FHT: not assessed due to gestational age  GENERAL APPEARANCE: alert, well appearing, in no apparent distress, oriented to person, place and time, overweight   ASSESSMENT UPT positive Home UPT positive Normal pregnancy  PLAN Prenatal care:  [facility] PAP: approximate date 12/13/2020 and was normal Labs to be completed at next visit Patient to take BP medication and call nurse with repeat blood pressure reading Advised on when to go to MAU regarding increased BP with symptoms   Blood Pressure  Monitor/Weight Scale BP: Home Weight Scale: Home  MyChart/Babyscripts MyChart access verified. I explained pt will have some visits in office and some virtually. Babyscripts instructions given and order placed.   Placed OB Box on problem list and updated  Followup with Janet Jacobs, CNM APP 03/26/2021  8:55 AM  Follow Up Instructions:   I discussed the assessment and treatment plan with the patient. The patient was provided an opportunity to ask questions and all were answered. The patient agreed with the plan and demonstrated an understanding of the instructions.   The patient was advised to call back or seek an in-person evaluation if the symptoms worsen or if the condition fails to improve as anticipated.  I provided 40 minutes of  face-to-face time during this encounter.  Janet Barrow, RN

## 2021-03-11 DIAGNOSIS — F4322 Adjustment disorder with anxiety: Secondary | ICD-10-CM | POA: Diagnosis not present

## 2021-03-15 DIAGNOSIS — Z630 Problems in relationship with spouse or partner: Secondary | ICD-10-CM | POA: Diagnosis not present

## 2021-03-15 DIAGNOSIS — F4322 Adjustment disorder with anxiety: Secondary | ICD-10-CM | POA: Diagnosis not present

## 2021-03-18 DIAGNOSIS — I10 Essential (primary) hypertension: Secondary | ICD-10-CM | POA: Diagnosis not present

## 2021-03-26 ENCOUNTER — Ambulatory Visit (INDEPENDENT_AMBULATORY_CARE_PROVIDER_SITE_OTHER): Payer: BC Managed Care – PPO | Admitting: Certified Nurse Midwife

## 2021-03-26 ENCOUNTER — Other Ambulatory Visit (HOSPITAL_COMMUNITY)
Admission: RE | Admit: 2021-03-26 | Discharge: 2021-03-26 | Disposition: A | Discharge status: Home / Self Care | Payer: BC Managed Care – PPO | Source: Ambulatory Visit | Attending: Certified Nurse Midwife | Admitting: Certified Nurse Midwife

## 2021-03-26 ENCOUNTER — Other Ambulatory Visit: Payer: Self-pay

## 2021-03-26 VITALS — BP 139/93 | HR 91 | Temp 97.7°F | Wt 300.4 lb

## 2021-03-26 DIAGNOSIS — O0990 Supervision of high risk pregnancy, unspecified, unspecified trimester: Secondary | ICD-10-CM | POA: Insufficient documentation

## 2021-03-26 DIAGNOSIS — Z3A10 10 weeks gestation of pregnancy: Secondary | ICD-10-CM | POA: Diagnosis not present

## 2021-03-26 DIAGNOSIS — O169 Unspecified maternal hypertension, unspecified trimester: Secondary | ICD-10-CM | POA: Diagnosis not present

## 2021-03-26 DIAGNOSIS — Z3491 Encounter for supervision of normal pregnancy, unspecified, first trimester: Secondary | ICD-10-CM

## 2021-03-26 DIAGNOSIS — Z3481 Encounter for supervision of other normal pregnancy, first trimester: Secondary | ICD-10-CM | POA: Diagnosis not present

## 2021-03-26 DIAGNOSIS — Z31430 Encounter of female for testing for genetic disease carrier status for procreative management: Secondary | ICD-10-CM | POA: Diagnosis not present

## 2021-03-26 DIAGNOSIS — O368390 Maternal care for abnormalities of the fetal heart rate or rhythm, unspecified trimester, not applicable or unspecified: Secondary | ICD-10-CM

## 2021-03-26 LAB — HEPATITIS C ANTIBODY: HCV Ab: NEGATIVE

## 2021-03-26 NOTE — Progress Notes (Signed)
History:   Janet Jacobs is a 40 y.o. [id number] at [redacted]w[redacted]d by LMP being seen today for her first obstetrical visit.  Her obstetrical history is significant for advanced maternal age and previous Cesarean for breech presentation after two uncomplicated term vaginal deliveries . Did not have a good experience with her Cesarean (poor pain control and improperly placed urinary catheter causing urine retention), so switched from [place] to [place]. This was not a planned pregnancy, her husband is planning a vasectomy for birth control. Patient does intend to breast feed. Pregnancy history fully reviewed.  Patient reports  occasional nausea but is able to control it with frequent, small meals. No other physical complaints .   HISTORY: OB History  Gravida Para Term Preterm AB Living  $Remov'6 3 3 'qHLjNg$ 0 2 3  SAB IAB Ectopic Multiple Live Births  0 2 0 0 3    # Outcome Date GA Lbr Len/2nd Weight Sex Delivery Anes PTL Lv  6 Current           5 Term 08/27/15 [redacted]w[redacted]d  8 lb 4.3 oz (3.751 kg) M CS-LTranv Spinal  LIV     Name: WOLF BERKLEY,BOY Kamyra     Apgar1: 9  Apgar5: 9  4 Term 05/30/13 [redacted]w[redacted]d 06:10 / 00:03 7 lb 15 oz (3.6 kg) M Vag-Spont None  LIV     Name: WOLF BERKLEY,BOY Letetia     Apgar1: 8  Apgar5: 9  3 Term 04/30/11 [redacted]w[redacted]d 09:35 / 01:14 7 lb 13 oz (3.544 kg) F Vag-Spont Local  LIV     Birth Comments: WNL     Name: WOLF,GIRL Amey     Apgar1: 8  Apgar5: 9  2 IAB           1 IAB             Last pap smear was done 12/2020 and was normal  Past Medical History:  Diagnosis Date   Chronic hepatitis B without delta agent without cirrhosis ([place]) 45/40/9811   Complication of anesthesia    HAS RESISTANCE TO LIDOCAINE SETTING UP AT DENTIST AND PAIN CONTROL.  wOKE UP DURING GENERAL ANESTHESIA FOR [facility] TEETH   Delivered by cesarean section 08/27/2015   Hypertension    No pertinent past medical history    Past Surgical History:  Procedure Laterality Date   CESAREAN SECTION N/A 08/27/2015    Procedure: CESAREAN SECTION;  Surgeon: Waymon Amato, MD;  Location: [facility];  Service: Obstetrics;  Laterality: N/A;   WISDOM TOOTH EXTRACTION     Family History  Problem Relation Age of Onset   Hypertension Mother    Miscarriages / [place] Mother    Arthritis Father    Diabetes Father    Hypertension Father    Alcohol abuse Neg Hx    Asthma Neg Hx    Birth defects Neg Hx    Cancer Neg Hx    COPD Neg Hx    Depression Neg Hx    Drug abuse Neg Hx    Early death Neg Hx    Hearing loss Neg Hx    Heart disease Neg Hx    Hyperlipidemia Neg Hx    Kidney disease Neg Hx    Learning disabilities Neg Hx    Mental illness Neg Hx    Mental retardation Neg Hx    Stroke Neg Hx    Vision loss Neg Hx    Social History   Tobacco Use   Smoking status:  Never    Passive exposure: Never   Smokeless tobacco: Never  Vaping Use   Vaping Use: Never used  Substance Use Topics   Alcohol use: No   Drug use: No   Allergies  Allergen Reactions   Latex Hives, Itching and Rash   Current Outpatient Medications on File Prior to Visit  Medication Sig Dispense Refill   lansoprazole (PREVACID) 30 MG capsule Take 30 mg by mouth at bedtime.     Prenatal Vit-Fe Fumarate-FA (PRENATAL MULTIVITAMIN) TABS Take 1 tablet by mouth at bedtime.      amLODipine (NORVASC) 10 MG tablet Take 10 mg by mouth daily.     No current facility-administered medications on file prior to visit.   Review of Systems Pertinent items noted in HPI and remainder of comprehensive ROS otherwise negative. Physical Exam:   Vitals:   03/26/21 0907  BP: (!) 139/93  Pulse: 91  Temp: 97.7 F (36.5 C)  Weight: (!) 300 lb 6.4 oz (136.3 kg)   Could not hear FHT using Doppler, heard strong uterine flow but nothing else. Office bedside u/s malfunctioning so sent for U/S at MFM asap for dating as pt stated her dates could be off making her less than 10wks.  Constitutional: Well-developed, well-nourished pregnant female in  no acute distress.  HEENT: PERRLA Skin: normal color and turgor, no rash Cardiovascular: normal rate & rhythm Respiratory: normal effort GI: Abd soft, non-tender, gravid appropriate for gestational age MS: Extremities nontender, no edema, normal ROM Neurologic: Alert and oriented x 4.  GU: no CVA tenderness Pelvic: exam deferred  Assessment:    Pregnancy: [id number] Patient Active Problem List   Diagnosis Date Noted   Supervision of high risk pregnancy, antepartum 03/10/2021   Chronic hepatitis B without delta agent without cirrhosis ([place]) 09/04/2015   Headache 09/03/2015   Elevated blood pressure affecting pregnancy, antepartum 09/03/2015   Latex allergy 08/23/2015   BMI 45.0-49.9, adult ([place]) 08/23/2015   AMA (advanced maternal age) multigravida 35+ 08/23/2015   History of termination of pregnancy x 2 08/23/2015   Cystic hygroma 01/04/2013     Plan:    1. Supervision of high risk pregnancy, antepartum - Doing well overall, unsure of dates but note feeling fetal movement yet - Obstetric Panel, Including HIV - Hepatitis C Antibody - Culture, OB Urine - Hemoglobin A1c - Genetic Screening - Urine cytology ancillary only([place])  2. [redacted] weeks gestation of pregnancy - Routine OB care   3. Elevated blood pressure affecting pregnancy, antepartum - Recheck was normal, discussed methods to prevent hypertension - Protein / creatinine ratio, urine - Comp Met (CMET)  4. Unable to hear fetal heart tones as reason for ultrasound scan - Reassurance given, U/S scheduled for 04/02/21 - US OB LESS THAN 14 WEEKS WITH OB TRANSVAGINAL; Future  5. Initial OB visit in first trimester - Initial labs drawn. - Continue prenatal vitamins. - Problem list reviewed and updated. - Genetic Screening discussed, First trimester screen, Quad screen, and NIPS: ordered. - Ultrasound discussed; fetal anatomic survey: ordered. - Anticipatory guidance about prenatal visits given including labs,  ultrasounds, and testing. - Discussed usage of Babyscripts and virtual visits as additional source of managing and completing prenatal visits in midst of coronavirus and pandemic.   - Encouraged to complete MyChart Registration for her ability to review results, send requests, and have questions addressed.  - The nature of [place] [facility]/Faculty Practice with multiple MDs and Advanced Practice Providers was explained to  patient; also emphasized that residents, students are part of our team. - Routine obstetric precautions reviewed. Encouraged to seek out care at office or emergency room [facility] MAU preferred) for urgent and/or emergent concerns. Return in about 4 weeks (around 04/23/2021) for IN-PERSON, [place].    Gaylan Gerold, MSN, CNM, [place] Certified Nurse Midwife, [facility]

## 2021-03-27 DIAGNOSIS — Z630 Problems in relationship with spouse or partner: Secondary | ICD-10-CM | POA: Diagnosis not present

## 2021-03-27 DIAGNOSIS — F4322 Adjustment disorder with anxiety: Secondary | ICD-10-CM | POA: Diagnosis not present

## 2021-03-27 LAB — HEPATITIS C ANTIBODY: Hep C Virus Ab: <0.1 s/co ratio (ref 0.0–0.9)

## 2021-03-28 LAB — COMPREHENSIVE METABOLIC PANEL
ALT: 11 IU/L (ref 0–32)
AST: 14 IU/L (ref 0–40)
Albumin/Globulin Ratio: 1.4 (ref 1.2–2.2)
Albumin: 4.2 g/dL (ref 3.8–4.8)
Alkaline Phosphatase: 81 IU/L (ref 44–121)
BUN/Creatinine Ratio: 13 (ref 9–23)
BUN: 6 mg/dL (ref 6–24)
Bilirubin Total: 0.5 mg/dL (ref 0.0–1.2)
CO2: 20 mmol/L (ref 20–29)
Calcium: 9.3 mg/dL (ref 8.7–10.2)
Chloride: 99 mmol/L (ref 96–106)
Creatinine, Ser: 0.46 mg/dL — ABNORMAL LOW (ref 0.57–1.00)
Globulin, Total: 2.9 g/dL (ref 1.5–4.5)
Glucose: 83 mg/dL (ref 70–99)
Potassium: 4 mmol/L (ref 3.5–5.2)
Sodium: 136 mmol/L (ref 134–144)
Total Protein: 7.1 g/dL (ref 6.0–8.5)
eGFR: 123 mL/min/{1.73_m2} (ref >59)

## 2021-03-28 LAB — OBSTETRIC PANEL, INCLUDING HIV
Antibody Screen: NEGATIVE
Basophils Absolute: 0.1 10*3/uL (ref 0.0–0.2)
Basos: 1 %
EOS (ABSOLUTE): 0.2 10*3/uL (ref 0.0–0.4)
Eos: 2 %
HIV Screen 4th Generation wRfx: NONREACTIVE
Hematocrit: 37.9 % (ref 34.0–46.6)
Hemoglobin: 12.9 g/dL (ref 11.1–15.9)
Hepatitis B Surface Ag: NEGATIVE
Immature Grans (Abs): 0 10*3/uL (ref 0.0–0.1)
Immature Granulocytes: 0 %
Lymphocytes Absolute: 2.4 10*3/uL (ref 0.7–3.1)
Lymphs: 23 %
MCH: 27.6 pg (ref 26.6–33.0)
MCHC: 34 g/dL (ref 31.5–35.7)
MCV: 81 fL (ref 79–97)
Monocytes Absolute: 0.4 10*3/uL (ref 0.1–0.9)
Monocytes: 4 %
Neutrophils Absolute: 7.3 10*3/uL — ABNORMAL HIGH (ref 1.4–7.0)
Neutrophils: 70 %
Platelets: 263 10*3/uL (ref 150–450)
RBC: 4.68 x10E6/uL (ref 3.77–5.28)
RDW: 13.9 % (ref 11.7–15.4)
RPR Ser Ql: NONREACTIVE
Rh Factor: POSITIVE
Rubella Antibodies, IGG: 1 index (ref >0.99)
WBC: 10.4 10*3/uL (ref 3.4–10.8)

## 2021-03-28 LAB — HEMOGLOBIN A1C
Est. average glucose Bld gHb Est-mCnc: 111 mg/dL
Hgb A1c MFr Bld: 5.5 % (ref 4.8–5.6)

## 2021-03-28 LAB — PROTEIN / CREATININE RATIO, URINE
Creatinine, Urine: 136.3 mg/dL
Protein, Ur: 11.8 mg/dL
Protein/Creat Ratio: 87 mg/g creat (ref 0–200)

## 2021-03-28 LAB — CULTURE, OB URINE

## 2021-03-28 LAB — URINE CULTURE, OB REFLEX

## 2021-04-02 ENCOUNTER — Ambulatory Visit
Admission: RE | Admit: 2021-04-02 | Discharge: 2021-04-02 | Disposition: A | Discharge status: Home / Self Care | Payer: BC Managed Care – PPO | Source: Ambulatory Visit | Attending: Certified Nurse Midwife | Admitting: Certified Nurse Midwife

## 2021-04-02 ENCOUNTER — Other Ambulatory Visit: Payer: Self-pay | Admitting: Certified Nurse Midwife

## 2021-04-02 ENCOUNTER — Other Ambulatory Visit: Payer: Self-pay

## 2021-04-02 DIAGNOSIS — O0990 Supervision of high risk pregnancy, unspecified, unspecified trimester: Secondary | ICD-10-CM

## 2021-04-02 DIAGNOSIS — Z3A10 10 weeks gestation of pregnancy: Secondary | ICD-10-CM | POA: Diagnosis not present

## 2021-04-02 DIAGNOSIS — Z3A11 11 weeks gestation of pregnancy: Secondary | ICD-10-CM | POA: Diagnosis not present

## 2021-04-02 DIAGNOSIS — O3680X0 Pregnancy with inconclusive fetal viability, not applicable or unspecified: Secondary | ICD-10-CM | POA: Diagnosis not present

## 2021-04-02 DIAGNOSIS — O368390 Maternal care for abnormalities of the fetal heart rate or rhythm, unspecified trimester, not applicable or unspecified: Secondary | ICD-10-CM | POA: Diagnosis not present

## 2021-04-03 ENCOUNTER — Telehealth: Payer: Self-pay | Admitting: *Deleted

## 2021-04-03 ENCOUNTER — Telehealth: Payer: Self-pay | Admitting: Medical

## 2021-04-03 NOTE — Telephone Encounter (Signed)
[place], [employer] called reporting a High Risk Screening report. HIGH RISK due to fetal DNA Fraction 2.5%. The cut off is 2.8%. Please see report for results and recommendations from [place]. Report printed and scanned into patient's record.  Derl Barrow, RN

## 2021-04-03 NOTE — Telephone Encounter (Signed)
I called [name] today at 8:28 AM and confirmed patient's identity using two patient identifiers. [place] results from yesterday were reviewed. Patient is scheduled for her next OB visit at [facility] on 04/23/21. First trimester warning signs reviewed. Patient voiced understanding and had no further questions.   US OB Comp Less 14 Wks  Result Date: 04/02/2021 CLINICAL DATA:  First trimester pregnancy with inconclusive fetal viability. Unsure of LMP. EXAM: OBSTETRIC <14 WK ULTRASOUND TECHNIQUE: Transabdominal ultrasound was performed for evaluation of the gestation as well as the maternal uterus and adnexal regions. COMPARISON:  None. FINDINGS: Intrauterine gestational sac: Single Yolk sac:  Not Visualized. Embryo:  Visualized. Cardiac Activity: Visualized. Heart Rate: 167 bpm CRL:   50 mm   11 w 5 d                  [place] EDC: 10/17/2021 Subchorionic hemorrhage:  None visualized. Maternal uterus/adnexae: Neither ovary directly visualized on this exam, however no mass or abnormal free fluid identified. IMPRESSION: Single living IUP with estimated gestational age of [redacted] weeks 5 days, and [place] EDC of 10/17/2021. Electronically Signed   By: Marlaine Hind M.D.   On: 04/02/2021 16:17    Luvenia Redden, PA-C 04/03/2021 8:28 AM

## 2021-04-04 ENCOUNTER — Telehealth: Payer: Self-pay | Admitting: Certified Nurse Midwife

## 2021-04-04 NOTE — Telephone Encounter (Signed)
Called patient to discuss Panorama results (high risk due to low fetal fraction and calculated by maternal age, weight, etc). Recommendation from Johnsie Cancel is to pursue additional testing. Offered a referral to MFM now or to repeat Natera test. Pt reports that this happened in another pregnancy. She retested 5x and could never get a high enough fetal fraction to get a reliable result so she went ahead with an amniocentesis. This time she prefers to just wait for the anatomy scan and proceed as indicated by the ultrasound.   Gaylan Gerold, CNM, MSN, [place] Certified Nurse Midwife, [facility]

## 2021-04-09 LAB — URINE CYTOLOGY ANCILLARY ONLY
Chlamydia: NEGATIVE
Comment: NEGATIVE
Comment: NEGATIVE
Comment: NORMAL
Neisseria Gonorrhea: NEGATIVE
Trichomonas: NEGATIVE

## 2021-04-10 DIAGNOSIS — Z630 Problems in relationship with spouse or partner: Secondary | ICD-10-CM | POA: Diagnosis not present

## 2021-04-10 DIAGNOSIS — F4322 Adjustment disorder with anxiety: Secondary | ICD-10-CM | POA: Diagnosis not present

## 2021-04-15 DIAGNOSIS — Z23 Encounter for immunization: Secondary | ICD-10-CM | POA: Diagnosis not present

## 2021-04-15 DIAGNOSIS — I10 Essential (primary) hypertension: Secondary | ICD-10-CM | POA: Diagnosis not present

## 2021-04-19 DIAGNOSIS — Z630 Problems in relationship with spouse or partner: Secondary | ICD-10-CM | POA: Diagnosis not present

## 2021-04-19 DIAGNOSIS — F4322 Adjustment disorder with anxiety: Secondary | ICD-10-CM | POA: Diagnosis not present

## 2021-04-23 ENCOUNTER — Other Ambulatory Visit: Payer: Self-pay

## 2021-04-23 ENCOUNTER — Ambulatory Visit (INDEPENDENT_AMBULATORY_CARE_PROVIDER_SITE_OTHER): Payer: BC Managed Care – PPO | Admitting: Advanced Practice Midwife

## 2021-04-23 VITALS — BP 138/88 | HR 84 | Temp 97.5°F | Wt 306.2 lb

## 2021-04-23 DIAGNOSIS — Z98891 History of uterine scar from previous surgery: Secondary | ICD-10-CM

## 2021-04-23 DIAGNOSIS — Z3483 Encounter for supervision of other normal pregnancy, third trimester: Secondary | ICD-10-CM

## 2021-04-23 DIAGNOSIS — O10919 Unspecified pre-existing hypertension complicating pregnancy, unspecified trimester: Secondary | ICD-10-CM

## 2021-04-23 MED ORDER — ASPIRIN EC 81 MG PO TBEC: 81.0000 mg | DELAYED_RELEASE_TABLET | Freq: Every day | ORAL | 2 refills | Status: DC

## 2021-04-23 NOTE — Progress Notes (Signed)
° °  PRENATAL VISIT NOTE  Subjective:  Janet Jacobs is a 41 y.o. [phone] at [redacted]w[redacted]d being seen today for ongoing prenatal care.  She is currently monitored for the following issues for this high-risk pregnancy and has Latex allergy; BMI 45.0-49.9, adult ([place]); AMA (advanced maternal age) multigravida 53+; Chronic hypertension affecting pregnancy; Chronic hepatitis B without delta agent without cirrhosis ([place]); Cystic hygroma; and Supervision of high risk pregnancy, antepartum on their problem list.  Patient reports no complaints.  Contractions: Not present. Vag. Bleeding: None.  Movement: Present. Denies leaking of fluid.   The following portions of the patient's history were reviewed and updated as appropriate: allergies, current medications, past family history, past medical history, past social history, past surgical history and problem list. Problem list updated.  Objective:   Vitals:   04/23/21 0927  BP: 138/88  Pulse: 84  Temp: (!) 97.5 F (36.4 C)  Weight: (!) 306 lb 3.2 oz (138.9 kg)    Fetal Status: Fetal Heart Rate (bpm): 160   Movement: Present     General:  Alert, oriented and cooperative. Patient is in no acute distress.  Skin: Skin is warm and dry. No rash noted.   Cardiovascular: Normal heart rate noted  Respiratory: Normal respiratory effort, no problems with respiration noted  Abdomen: Soft, gravid, appropriate for gestational age.  Pain/Pressure: Absent     Pelvic: Cervical exam deferred        Extremities: Normal range of motion.  Edema: None  Mental Status: Normal mood and affect. Normal behavior. Normal judgment and thought content.   Assessment and Plan:  Pregnancy: [id number] at [redacted]w[redacted]d  1. Encounter for supervision of other normal pregnancy in third trimester - Routine care, declines AFP - Advised daily bASA 81 mg for Preeclampsia prophylaxis. Pt agreeable, rx to pharmacy - Anatomy scan scheduled during today's appointment - [place] MFM OB DETAIL +14 [place];  Future  2. Chronic hypertension affecting pregnancy - On Procardia XL 30 mg as prescribed by PCP about one month ago - Discussed with Dr. Nelda Marseille, no need to switch to Labetalol   3. Hx of cesarean section - For breech presentation - Desires TOLAC  Preterm labor symptoms and general obstetric precautions including but not limited to vaginal bleeding, contractions, leaking of fluid and fetal movement were reviewed in detail with the patient. Please refer to After Visit Summary for other counseling recommendations.    Future Appointments  Date Time Provider [place]  05/22/2021 10:15 AM Laury Deep, CNM [facility] None  05/26/2021  8:30 AM [facility] NURSE [facility]  05/26/2021  8:45 AM [facility] US5 [facility]  06/19/2021  8:35 AM Laury Deep, CNM [facility] None  07/24/2021  8:55 AM Laury Deep, CNM [facility] None    Darlina Rumpf, [place]

## 2021-05-22 ENCOUNTER — Encounter: Payer: BC Managed Care – PPO | Admitting: Obstetrics and Gynecology

## 2021-05-26 ENCOUNTER — Ambulatory Visit: Payer: BC Managed Care – PPO | Attending: Advanced Practice Midwife

## 2021-05-26 ENCOUNTER — Ambulatory Visit: Payer: BC Managed Care – PPO | Admitting: *Deleted

## 2021-05-26 ENCOUNTER — Other Ambulatory Visit: Payer: Self-pay

## 2021-05-26 ENCOUNTER — Encounter: Payer: Self-pay | Admitting: *Deleted

## 2021-05-26 ENCOUNTER — Ambulatory Visit (HOSPITAL_BASED_OUTPATIENT_CLINIC_OR_DEPARTMENT_OTHER): Payer: BC Managed Care – PPO | Admitting: Obstetrics

## 2021-05-26 ENCOUNTER — Other Ambulatory Visit: Payer: Self-pay | Admitting: *Deleted

## 2021-05-26 VITALS — BP 148/91 | HR 88

## 2021-05-26 DIAGNOSIS — O10012 Pre-existing essential hypertension complicating pregnancy, second trimester: Secondary | ICD-10-CM | POA: Diagnosis not present

## 2021-05-26 DIAGNOSIS — O34219 Maternal care for unspecified type scar from previous cesarean delivery: Secondary | ICD-10-CM | POA: Insufficient documentation

## 2021-05-26 DIAGNOSIS — O09522 Supervision of elderly multigravida, second trimester: Secondary | ICD-10-CM | POA: Diagnosis not present

## 2021-05-26 DIAGNOSIS — O99212 Obesity complicating pregnancy, second trimester: Secondary | ICD-10-CM | POA: Insufficient documentation

## 2021-05-26 DIAGNOSIS — O0990 Supervision of high risk pregnancy, unspecified, unspecified trimester: Secondary | ICD-10-CM

## 2021-05-26 DIAGNOSIS — O10912 Unspecified pre-existing hypertension complicating pregnancy, second trimester: Secondary | ICD-10-CM | POA: Diagnosis not present

## 2021-05-26 DIAGNOSIS — Z3483 Encounter for supervision of other normal pregnancy, third trimester: Secondary | ICD-10-CM

## 2021-05-26 DIAGNOSIS — Z3A19 19 weeks gestation of pregnancy: Secondary | ICD-10-CM

## 2021-05-26 DIAGNOSIS — Z6841 Body Mass Index (BMI) 40.0 and over, adult: Secondary | ICD-10-CM

## 2021-05-26 DIAGNOSIS — O289 Unspecified abnormal findings on antenatal screening of mother: Secondary | ICD-10-CM | POA: Insufficient documentation

## 2021-05-26 DIAGNOSIS — Z363 Encounter for antenatal screening for malformations: Secondary | ICD-10-CM | POA: Insufficient documentation

## 2021-05-26 DIAGNOSIS — O10919 Unspecified pre-existing hypertension complicating pregnancy, unspecified trimester: Secondary | ICD-10-CM | POA: Insufficient documentation

## 2021-05-26 NOTE — Progress Notes (Signed)
MFM Note  Janet Jacobs was seen for a detailed fetal anatomy scan due to advanced maternal age (41 years old), maternal obesity with a BMI of 52.3, and chronic hypertension treated with Procardia 30 mg daily.  Her cell free DNA test also indicated a high risk for triploidy, trisomy 77, and 13 due to a low fetal fraction.  She denies any other problems in her current pregnancy.    She was informed that the fetal growth and amniotic fluid level were appropriate for her gestational age.   The views of the fetal anatomy were limited today due to maternal body habitus and the fetal position.  The patient was informed that anomalies may be missed due to technical limitations. If the fetus is in a suboptimal position or maternal habitus is increased, visualization of the fetus in the maternal uterus may be impaired.  The following were discussed during our consultation today:  Advanced maternal age with abnormal cell free DNA test  The increased risk of fetal aneuploidy due to advanced maternal age was discussed.   Due to the abnormal cell free DNA test, the patient was offered an opportunity to have the test redrawn.  She declined to have the test redrawn today as she stated that she had an abnormal cell free DNA test during her last pregnancy and everything turned out to be normal. Therefore she is less concerned with this pregnancy.  She was offered and declined an amniocentesis today for definitive diagnosis of fetal aneuploidy.  Chronic hypertension in pregnancy  The implications and management of chronic hypertension in pregnancy was discussed. The patient was advised that should her blood pressures continue to be elevated, the dosage of her antihypertensive medications may need to be increased.    The increased risk of superimposed preeclampsia, an indicated preterm delivery, and possible fetal growth restriction due to chronic hypertension in pregnancy was discussed.   We will  continue to follow her with monthly growth scans. Weekly fetal testing should be started at around 32 weeks.   To decrease her risk of superimposed preeclampsia, she continue taking a daily baby aspirin (81 mg daily) for preeclampsia prophylaxis.   Obesity in pregnancy  The recommended total weight gain in pregnancy for obese women's between 10 to 20 pounds.  Due to obesity, she should be screened for gestational diabetes at between 74 to 28 weeks.    As maternal obesity may present challenges associated with the management of anesthesia, an anesthesia consult should be obtained when she is admitted in labor.  A follow-up exam was scheduled in 4 weeks to assess the fetal growth and to complete the views of the fetal anatomy.  The patient stated that all of her questions have been answered to her satisfaction.    We will continue to follow her closely with you throughout her pregnancy.  A total of 30 minutes was spent counseling and coordinating the care for this patient.  Greater than 50% of the time was spent in direct face-to-face contact.

## 2021-05-27 DIAGNOSIS — F4322 Adjustment disorder with anxiety: Secondary | ICD-10-CM | POA: Diagnosis not present

## 2021-05-29 DIAGNOSIS — Z630 Problems in relationship with spouse or partner: Secondary | ICD-10-CM | POA: Diagnosis not present

## 2021-05-29 DIAGNOSIS — F4322 Adjustment disorder with anxiety: Secondary | ICD-10-CM | POA: Diagnosis not present

## 2021-06-04 ENCOUNTER — Other Ambulatory Visit: Payer: Self-pay

## 2021-06-04 ENCOUNTER — Ambulatory Visit (INDEPENDENT_AMBULATORY_CARE_PROVIDER_SITE_OTHER): Payer: BC Managed Care – PPO | Admitting: Certified Nurse Midwife

## 2021-06-04 VITALS — BP 137/91 | HR 101 | Wt 308.6 lb

## 2021-06-04 DIAGNOSIS — Z3A20 20 weeks gestation of pregnancy: Secondary | ICD-10-CM

## 2021-06-04 DIAGNOSIS — O0992 Supervision of high risk pregnancy, unspecified, second trimester: Secondary | ICD-10-CM

## 2021-06-04 DIAGNOSIS — O10919 Unspecified pre-existing hypertension complicating pregnancy, unspecified trimester: Secondary | ICD-10-CM

## 2021-06-04 NOTE — Progress Notes (Signed)
? ?  PRENATAL VISIT NOTE ? ?Subjective:  ?Janet Jacobs is a 41 y.o. [phone] at [redacted]w[redacted]d being seen today for ongoing prenatal care.  She is currently monitored for the following issues for this high-risk pregnancy and has Latex allergy; BMI 45.0-49.9, adult ([place]); AMA (advanced maternal age) multigravida 69+; Chronic hypertension affecting pregnancy; Chronic hepatitis B without delta agent without cirrhosis ([place]); Cystic hygroma; and Supervision of high risk pregnancy, antepartum on their problem list. ? ?Patient reports no complaints.  Contractions: Not present. Vag. Bleeding: None.  Movement: Present. Denies leaking of fluid.  ? ?The following portions of the patient's history were reviewed and updated as appropriate: allergies, current medications, past family history, past medical history, past social history, past surgical history and problem list.  ? ?Objective:  ? ?Vitals:  ? 06/04/21 1330  ?BP: (!) 137/91  ?Pulse: (!) 101  ?Weight: (!) 308 lb 9.6 oz (140 kg)  ? ? ?Fetal Status: Fetal Heart Rate (bpm): 148   Movement: Present    ? ?General:  Alert, oriented and cooperative. Patient is in no acute distress.  ?Skin: Skin is warm and dry. No rash noted.   ?Cardiovascular: Normal heart rate noted  ?Respiratory: Normal respiratory effort, no problems with respiration noted  ?Abdomen: Soft, gravid, appropriate for gestational age.  Pain/Pressure: Absent     ?Pelvic: Cervical exam deferred        ?Extremities: Normal range of motion.  Edema: None  ?Mental Status: Normal mood and affect. Normal behavior. Normal judgment and thought content.  ? ?Assessment and Plan:  ?Pregnancy: [id number] at 100w5d ?1. Supervision of high risk pregnancy in second trimester ?- Doing well, feeling regular and vigorous fetal movement  ? ?2. [redacted] weeks gestation of pregnancy ?- Routine OB care  ?- Reviewed U/S results ? ?3. Chronic hypertension affecting pregnancy ?- Taking procardia and aspirin as ordered ? ?Preterm labor symptoms and  general obstetric precautions including but not limited to vaginal bleeding, contractions, leaking of fluid and fetal movement were reviewed in detail with the patient. ?Please refer to After Visit Summary for other counseling recommendations.  ? ?Return in about 4 weeks (around 07/02/2021) for IN-PERSON, [place]. ? ?Future Appointments  ?Date Time Provider [place]  ?06/23/2021 10:30 AM [facility] NURSE [facility]  ?06/23/2021 10:45 AM [facility] US5 [facility]  ?07/09/2021 10:15 AM Gabriel Carina, CNM [facility] [place]  ? ? ?Gabriel Carina, CNM ?

## 2021-06-17 DIAGNOSIS — F4322 Adjustment disorder with anxiety: Secondary | ICD-10-CM | POA: Diagnosis not present

## 2021-06-19 ENCOUNTER — Encounter: Payer: BC Managed Care – PPO | Admitting: Obstetrics and Gynecology

## 2021-06-23 ENCOUNTER — Encounter: Payer: Self-pay | Admitting: *Deleted

## 2021-06-23 ENCOUNTER — Other Ambulatory Visit: Payer: Self-pay | Admitting: *Deleted

## 2021-06-23 ENCOUNTER — Ambulatory Visit: Payer: BC Managed Care – PPO | Attending: Obstetrics

## 2021-06-23 ENCOUNTER — Other Ambulatory Visit: Payer: Self-pay

## 2021-06-23 ENCOUNTER — Ambulatory Visit: Payer: BC Managed Care – PPO | Admitting: *Deleted

## 2021-06-23 VITALS — BP 134/89 | HR 89

## 2021-06-23 DIAGNOSIS — O10919 Unspecified pre-existing hypertension complicating pregnancy, unspecified trimester: Secondary | ICD-10-CM

## 2021-06-23 DIAGNOSIS — O0990 Supervision of high risk pregnancy, unspecified, unspecified trimester: Secondary | ICD-10-CM | POA: Diagnosis not present

## 2021-06-23 DIAGNOSIS — O10912 Unspecified pre-existing hypertension complicating pregnancy, second trimester: Secondary | ICD-10-CM | POA: Insufficient documentation

## 2021-06-23 DIAGNOSIS — O281 Abnormal biochemical finding on antenatal screening of mother: Secondary | ICD-10-CM | POA: Diagnosis not present

## 2021-06-23 DIAGNOSIS — O99212 Obesity complicating pregnancy, second trimester: Secondary | ICD-10-CM

## 2021-06-23 DIAGNOSIS — Z3A23 23 weeks gestation of pregnancy: Secondary | ICD-10-CM

## 2021-06-23 DIAGNOSIS — Z363 Encounter for antenatal screening for malformations: Secondary | ICD-10-CM

## 2021-06-23 DIAGNOSIS — O34219 Maternal care for unspecified type scar from previous cesarean delivery: Secondary | ICD-10-CM

## 2021-06-23 DIAGNOSIS — O09522 Supervision of elderly multigravida, second trimester: Secondary | ICD-10-CM | POA: Insufficient documentation

## 2021-06-23 DIAGNOSIS — Z6841 Body Mass Index (BMI) 40.0 and over, adult: Secondary | ICD-10-CM

## 2021-06-23 DIAGNOSIS — O10012 Pre-existing essential hypertension complicating pregnancy, second trimester: Secondary | ICD-10-CM

## 2021-07-01 DIAGNOSIS — F4322 Adjustment disorder with anxiety: Secondary | ICD-10-CM | POA: Diagnosis not present

## 2021-07-09 ENCOUNTER — Ambulatory Visit (INDEPENDENT_AMBULATORY_CARE_PROVIDER_SITE_OTHER): Payer: BC Managed Care – PPO | Admitting: Certified Nurse Midwife

## 2021-07-09 VITALS — BP 128/79 | HR 105 | Wt 301.7 lb

## 2021-07-09 DIAGNOSIS — Z3A25 25 weeks gestation of pregnancy: Secondary | ICD-10-CM

## 2021-07-09 DIAGNOSIS — O0990 Supervision of high risk pregnancy, unspecified, unspecified trimester: Secondary | ICD-10-CM

## 2021-07-09 DIAGNOSIS — O10919 Unspecified pre-existing hypertension complicating pregnancy, unspecified trimester: Secondary | ICD-10-CM

## 2021-07-10 DIAGNOSIS — F4322 Adjustment disorder with anxiety: Secondary | ICD-10-CM | POA: Diagnosis not present

## 2021-07-10 DIAGNOSIS — Z630 Problems in relationship with spouse or partner: Secondary | ICD-10-CM | POA: Diagnosis not present

## 2021-07-10 NOTE — Progress Notes (Signed)
? ?PRENATAL VISIT NOTE ? ?Subjective:  ?Janet Jacobs is a 41 y.o. [phone] at 8w6dbeing seen today for ongoing prenatal care.  She is currently monitored for the following issues for this high-risk pregnancy and has Latex allergy; BMI 45.0-49.9, adult ([place]; AMA (advanced maternal age) multigravida 364+ Chronic hypertension affecting pregnancy; Chronic hepatitis B without delta agent without cirrhosis ([place]; Cystic hygroma; and Supervision of high risk pregnancy, antepartum on their problem list. ? ?Patient reports no complaints.  Contractions: Irritability. Vag. Bleeding: None.  Movement: Present. Denies leaking of fluid.  ? ?Expressed concern and great displeasure with MFM services - describes MDs she's seen as being somewhat cold, pushy, not listening to her concerns/desires and pushing excessive testing. Husband expressed concern that MFM wants weekly ultrasounds starting at 30wks but due to their private insurance, they are being charged $400 OOP because of the facility fee.  ? ?Because of the contraindication for TOLACs using hydrotherapy and the excessive testing costs, they are considering a [facility] and have secured midwifery care to do so. ? ?The following portions of the patient's history were reviewed and updated as appropriate: allergies, current medications, past family history, past medical history, past social history, past surgical history and problem list.  ? ?Objective:  ? ?Vitals:  ? 07/09/21 1023  ?BP: 128/79  ?Pulse: (!) 105  ?Weight: (!) 301 lb 11.2 oz (136.9 kg)  ? ? ?Fetal Status: Fetal Heart Rate (bpm): 152   Movement: Present    ? ?General:  Alert, oriented and cooperative. Patient is in no acute distress.  ?Skin: Skin is warm and dry. No rash noted.   ?Cardiovascular: Normal heart rate noted  ?Respiratory: Normal respiratory effort, no problems with respiration noted  ?Abdomen: Soft, gravid, appropriate for gestational age.  Pain/Pressure:  Present     ?Pelvic: Cervical exam deferred        ?Extremities: Normal range of motion.     ?Mental Status: Normal mood and affect. Normal behavior. Normal judgment and thought content.  ? ?Assessment and Plan:  ?Pregnancy: [id number]. Supervision of high risk pregnancy, antepartum ?- Doing well, feeling regular and vigorous fetal movement  ?- Reviewed risks of homebirth and need for close parallel care/continued antenatal testing (even if adjusted to meet financial constraints), importance of having a trained team at delivery, reasons for transfer, etc. ?- Pt has trained midwife that will be in attendance, understands risks of TOLAC and nature of TOLAC births and wishes to proceed with homebirth attempt. Validated desires and will continue parallel care. ? ?2. [redacted] weeks gestation of pregnancy ?- Routine OB care including anticipatory guidance re GTT at next visit ? ?3. Chronic hypertension affecting pregnancy ?- Explained that the increased surveillance recommended by MFM is related to her chronic hypertension and the increased risk of FGR associated with cHTN and AMA. Apologized that their experience has been so negative with MFM and encouraged FOB to speak to our billing department and their insurance company to see if they can get the cost reduced. ?- Offered weekly NST/BPP in our office starting at 30wks if they cannot/do not want to pay for each additional ultrasound, which they were much more agreeable to doing. ?- Pt fully understands that if she develops preeclampsia or FGR, etc a homebirth will cease to be a safe option and will be amenable to a hospital IOL. ? ?Preterm labor symptoms and general obstetric precautions including but not limited to vaginal bleeding, contractions, leaking  of fluid and fetal movement were reviewed in detail with the patient. ?Please refer to After Visit Summary for other counseling recommendations.  ? ?Return in about 2 weeks (around 07/23/2021) for IN-PERSON,  HOB/GTT. ? ?Future Appointments  ?Date Time Provider [place]  ?07/23/2021  9:15 AM [facility] NURSE [facility]  ?07/23/2021  9:30 AM [facility] US3 [facility]  ?07/28/2021  8:50 AM [facility]-WOCA LAB [facility]  ?07/28/2021 10:35 AM Gabriel Carina, CNM [facility]  ?08/13/2021 10:15 AM Gabriel Carina, CNM [facility]  ?08/27/2021 10:15 AM Gabriel Carina, CNM [facility]  ?09/10/2021 10:15 AM Gabriel Carina, CNM [facility]  ?09/17/2021  1:15 PM Gabriel Carina, CNM [facility]  ?09/24/2021 10:35 AM Gabriel Carina, CNM [facility]  ?10/01/2021 10:35 AM Gabriel Carina, CNM [facility]  ? ? ?Gabriel Carina, CNM ?

## 2021-07-21 ENCOUNTER — Other Ambulatory Visit: Payer: Self-pay

## 2021-07-21 DIAGNOSIS — O0990 Supervision of high risk pregnancy, unspecified, unspecified trimester: Secondary | ICD-10-CM

## 2021-07-22 DIAGNOSIS — F4322 Adjustment disorder with anxiety: Secondary | ICD-10-CM | POA: Diagnosis not present

## 2021-07-23 ENCOUNTER — Ambulatory Visit: Payer: BC Managed Care – PPO | Attending: Obstetrics and Gynecology

## 2021-07-23 ENCOUNTER — Encounter: Payer: Self-pay | Admitting: *Deleted

## 2021-07-23 ENCOUNTER — Other Ambulatory Visit: Payer: Self-pay | Admitting: *Deleted

## 2021-07-23 ENCOUNTER — Ambulatory Visit: Payer: BC Managed Care – PPO | Admitting: *Deleted

## 2021-07-23 VITALS — BP 132/72 | HR 91

## 2021-07-23 DIAGNOSIS — O10012 Pre-existing essential hypertension complicating pregnancy, second trimester: Secondary | ICD-10-CM | POA: Diagnosis not present

## 2021-07-23 DIAGNOSIS — E669 Obesity, unspecified: Secondary | ICD-10-CM

## 2021-07-23 DIAGNOSIS — O0990 Supervision of high risk pregnancy, unspecified, unspecified trimester: Secondary | ICD-10-CM

## 2021-07-23 DIAGNOSIS — O281 Abnormal biochemical finding on antenatal screening of mother: Secondary | ICD-10-CM

## 2021-07-23 DIAGNOSIS — O09522 Supervision of elderly multigravida, second trimester: Secondary | ICD-10-CM

## 2021-07-23 DIAGNOSIS — Z6841 Body Mass Index (BMI) 40.0 and over, adult: Secondary | ICD-10-CM | POA: Diagnosis not present

## 2021-07-23 DIAGNOSIS — O99212 Obesity complicating pregnancy, second trimester: Secondary | ICD-10-CM

## 2021-07-23 DIAGNOSIS — O09523 Supervision of elderly multigravida, third trimester: Secondary | ICD-10-CM

## 2021-07-23 DIAGNOSIS — O10919 Unspecified pre-existing hypertension complicating pregnancy, unspecified trimester: Secondary | ICD-10-CM

## 2021-07-23 DIAGNOSIS — Z3689 Encounter for other specified antenatal screening: Secondary | ICD-10-CM

## 2021-07-23 DIAGNOSIS — O34219 Maternal care for unspecified type scar from previous cesarean delivery: Secondary | ICD-10-CM

## 2021-07-23 DIAGNOSIS — Z3A27 27 weeks gestation of pregnancy: Secondary | ICD-10-CM

## 2021-07-24 ENCOUNTER — Encounter: Payer: BC Managed Care – PPO | Admitting: Obstetrics and Gynecology

## 2021-07-24 DIAGNOSIS — F4322 Adjustment disorder with anxiety: Secondary | ICD-10-CM | POA: Diagnosis not present

## 2021-07-24 DIAGNOSIS — Z630 Problems in relationship with spouse or partner: Secondary | ICD-10-CM | POA: Diagnosis not present

## 2021-07-28 ENCOUNTER — Other Ambulatory Visit: Payer: BC Managed Care – PPO

## 2021-07-28 ENCOUNTER — Ambulatory Visit (INDEPENDENT_AMBULATORY_CARE_PROVIDER_SITE_OTHER): Payer: BC Managed Care – PPO | Admitting: Certified Nurse Midwife

## 2021-07-28 VITALS — BP 126/80 | HR 86 | Wt 306.0 lb

## 2021-07-28 DIAGNOSIS — Z3A28 28 weeks gestation of pregnancy: Secondary | ICD-10-CM

## 2021-07-28 DIAGNOSIS — O0990 Supervision of high risk pregnancy, unspecified, unspecified trimester: Secondary | ICD-10-CM | POA: Diagnosis not present

## 2021-07-28 DIAGNOSIS — O09523 Supervision of elderly multigravida, third trimester: Secondary | ICD-10-CM

## 2021-07-28 DIAGNOSIS — O0993 Supervision of high risk pregnancy, unspecified, third trimester: Secondary | ICD-10-CM

## 2021-07-28 DIAGNOSIS — I10 Essential (primary) hypertension: Secondary | ICD-10-CM

## 2021-07-28 NOTE — Progress Notes (Signed)
? ?  PRENATAL VISIT NOTE ? ?Subjective:  ?Janet Jacobs is a 41 y.o. [phone] at 25w3dbeing seen today for ongoing prenatal care.  She is currently monitored for the following issues for this high-risk pregnancy and has Latex allergy; BMI 45.0-49.9, adult ([place]; AMA (advanced maternal age) multigravida 358+ Chronic hypertension affecting pregnancy; Chronic hepatitis B without delta agent without cirrhosis ([place]; Cystic hygroma; and Supervision of high risk pregnancy, antepartum on their problem list. ? ?Patient reports no complaints.  Contractions: Irritability. Vag. Bleeding: None.  Movement: Present. Denies leaking of fluid.  ? ?The following portions of the patient's history were reviewed and updated as appropriate: allergies, current medications, past family history, past medical history, past social history, past surgical history and problem list.  ? ?Objective:  ? ?Vitals:  ? 07/28/21 0935  ?BP: 126/80  ?Pulse: 86  ?Weight: (!) 306 lb (138.8 kg)  ? ? ?Fetal Status: Fetal Heart Rate (bpm): 145   Movement: Present    ? ?General:  Alert, oriented and cooperative. Patient is in no acute distress.  ?Skin: Skin is warm and dry. No rash noted.   ?Cardiovascular: Normal heart rate noted  ?Respiratory: Normal respiratory effort, no problems with respiration noted  ?Abdomen: Soft, gravid, appropriate for gestational age.  Pain/Pressure: Present     ?Pelvic: Cervical exam deferred        ?Extremities: Normal range of motion.  Edema: None  ?Mental Status: Normal mood and affect. Normal behavior. Normal judgment and thought content.  ? ?Assessment and Plan:  ?Pregnancy: [id number] 246w3d1. Supervision of high risk pregnancy in third trimester ?- Doing well, feeling regular and vigorous fetal movement  ? ?2. [redacted] weeks gestation of pregnancy ?- Routine OB care - having GTT today ? ?3. Chronic hypertension ?- BP normal today on procardia ? ?4. AMA (advanced maternal age) multigravida 3532+third trimester ?- Has  increased MFM surveillance scheduled ? ?Preterm labor symptoms and general obstetric precautions including but not limited to vaginal bleeding, contractions, leaking of fluid and fetal movement were reviewed in detail with the patient. ?Please refer to After Visit Summary for other counseling recommendations.  ? ?Return in about 2 weeks (around 08/11/2021) for IN-PERSON, HOF[place]? ?Future Appointments  ?Date Time Provider [place]?07/29/2021  8:50 AM WMC-WOCA LAB WMC-CWH WMC  ?08/13/2021 10:15 AM WaGabriel CarinaCNM [facility]?08/20/2021  9:15 AM WMC-MFC NURSE WMC-MFC WMC  ?08/20/2021  9:30 AM WMC-MFC US2 WMC-MFCUS WMC  ?08/27/2021 10:15 AM WaGabriel CarinaCNM [facility]?08/27/2021 11:15 AM WMC-MFC NURSE WMC-MFC WMC  ?08/27/2021 11:30 AM WMC-MFC US3 WMC-MFCUS WMC  ?09/03/2021 11:15 AM WMC-MFC NURSE WMC-MFC WMC  ?09/03/2021 11:30 AM WMC-MFC US3 WMC-MFCUS WMPenton?09/10/2021 10:15 AM WaGabriel CarinaCNM [facility]MD[facility]?09/17/2021  1:15 PM WaGabriel CarinaCNM WM[facility]?09/24/2021 10:35 AM WaGabriel CarinaCNM WMC[facility]CM[facility]?10/01/2021 10:35 AM WaGabriel CarinaCNM WMC-CWH [facility]? ? ?JaGabriel CarinaCNM ?

## 2021-07-29 ENCOUNTER — Other Ambulatory Visit: Payer: BC Managed Care – PPO

## 2021-07-29 DIAGNOSIS — O0990 Supervision of high risk pregnancy, unspecified, unspecified trimester: Secondary | ICD-10-CM | POA: Diagnosis not present

## 2021-07-29 LAB — RPR: RPR Ser Ql: NONREACTIVE

## 2021-07-29 LAB — CBC
Hematocrit: 33.4 % — ABNORMAL LOW (ref 34.0–46.6)
Hemoglobin: 11 g/dL — ABNORMAL LOW (ref 11.1–15.9)
MCH: 27.3 pg (ref 26.6–33.0)
MCHC: 32.9 g/dL (ref 31.5–35.7)
MCV: 83 fL (ref 79–97)
Platelets: 234 10*3/uL (ref 150–450)
RBC: 4.03 x10E6/uL (ref 3.77–5.28)
RDW: 14.5 % (ref 11.7–15.4)
WBC: 12.1 10*3/uL — ABNORMAL HIGH (ref 3.4–10.8)

## 2021-07-29 LAB — HIV ANTIBODY (ROUTINE TESTING W REFLEX): HIV Screen 4th Generation wRfx: NONREACTIVE

## 2021-07-30 LAB — GLUCOSE TOLERANCE, 2 HOURS W/ 1HR
Glucose, 1 hour: 169 mg/dL (ref 70–179)
Glucose, 2 hour: 127 mg/dL (ref 70–152)
Glucose, Fasting: 92 mg/dL — ABNORMAL HIGH (ref 70–91)

## 2021-07-31 DIAGNOSIS — F4322 Adjustment disorder with anxiety: Secondary | ICD-10-CM | POA: Diagnosis not present

## 2021-07-31 DIAGNOSIS — Z630 Problems in relationship with spouse or partner: Secondary | ICD-10-CM | POA: Diagnosis not present

## 2021-08-01 ENCOUNTER — Encounter: Payer: Self-pay | Admitting: Certified Nurse Midwife

## 2021-08-05 ENCOUNTER — Telehealth: Payer: Self-pay

## 2021-08-05 DIAGNOSIS — O24419 Gestational diabetes mellitus in pregnancy, unspecified control: Secondary | ICD-10-CM

## 2021-08-05 MED ORDER — ACCU-CHEK SOFTCLIX LANCETS MISC: 12 refills | Status: DC

## 2021-08-05 MED ORDER — ACCU-CHEK GUIDE W/DEVICE KIT: 1.0000 | PACK | 0 refills | Status: DC | PRN

## 2021-08-05 MED ORDER — ACCU-CHEK GUIDE VI STRP: ORAL_STRIP | 12 refills | Status: DC

## 2021-08-05 NOTE — Telephone Encounter (Addendum)
-----   Message from Gabriel Carina, CNM sent at 08/05/2021  9:40 AM EDT ----- ?Please get her in to see Levada Dy as soon as possible and send in her testing supplies. Thank you! ? ?Notified pt of results and the need for diabetes education that has been scheduled for 08/14/21 @ 1515 in which she will need to bring in her testing supplies.  Pt verbalized understanding with no further questions.  ? ?Rody Keadle,RN  ?08/05/21 ?

## 2021-08-09 DIAGNOSIS — Z630 Problems in relationship with spouse or partner: Secondary | ICD-10-CM | POA: Diagnosis not present

## 2021-08-09 DIAGNOSIS — F4322 Adjustment disorder with anxiety: Secondary | ICD-10-CM | POA: Diagnosis not present

## 2021-08-12 DIAGNOSIS — F4322 Adjustment disorder with anxiety: Secondary | ICD-10-CM | POA: Diagnosis not present

## 2021-08-13 ENCOUNTER — Encounter: Payer: Self-pay | Admitting: Certified Nurse Midwife

## 2021-08-13 ENCOUNTER — Ambulatory Visit (INDEPENDENT_AMBULATORY_CARE_PROVIDER_SITE_OTHER): Payer: BC Managed Care – PPO | Admitting: Certified Nurse Midwife

## 2021-08-13 VITALS — BP 127/84 | HR 93 | Wt 307.5 lb

## 2021-08-13 DIAGNOSIS — O24410 Gestational diabetes mellitus in pregnancy, diet controlled: Secondary | ICD-10-CM

## 2021-08-13 DIAGNOSIS — Z23 Encounter for immunization: Secondary | ICD-10-CM

## 2021-08-13 DIAGNOSIS — Z3A30 30 weeks gestation of pregnancy: Secondary | ICD-10-CM

## 2021-08-13 DIAGNOSIS — O0990 Supervision of high risk pregnancy, unspecified, unspecified trimester: Secondary | ICD-10-CM

## 2021-08-13 DIAGNOSIS — O10919 Unspecified pre-existing hypertension complicating pregnancy, unspecified trimester: Secondary | ICD-10-CM

## 2021-08-13 DIAGNOSIS — Z1331 Encounter for screening for depression: Secondary | ICD-10-CM

## 2021-08-14 ENCOUNTER — Other Ambulatory Visit: Payer: BC Managed Care – PPO

## 2021-08-14 ENCOUNTER — Encounter: Payer: Self-pay | Admitting: Radiology

## 2021-08-16 NOTE — Progress Notes (Signed)
? ?  PRENATAL VISIT NOTE ? ?Subjective:  ?Janet Jacobs is a 41 y.o. [phone] at 75w1dbeing seen today for ongoing prenatal care.  She is currently monitored for the following issues for this high-risk pregnancy and has Latex allergy; BMI 45.0-49.9, adult ([place]; AMA (advanced maternal age) multigravida 369+ Chronic hypertension affecting pregnancy; Chronic hepatitis B without delta agent without cirrhosis (HW[place]; Cystic hygroma; and Supervision of high risk pregnancy, antepartum on their problem list. ? ?Patient reports no complaints.  Contractions: Irritability. Vag. Bleeding: None.  Movement: Present. Denies leaking of fluid.  ? ?The following portions of the patient's history were reviewed and updated as appropriate: allergies, current medications, past family history, past medical history, past social history, past surgical history and problem list.  ? ?Objective:  ? ?Vitals:  ? 08/13/21 1057  ?BP: 127/84  ?Pulse: 93  ?Weight: (!) 307 lb 8 oz (139.5 kg)  ? ? ?Fetal Status: Fetal Heart Rate (bpm): 146   Movement: Present    ? ?General:  Alert, oriented and cooperative. Patient is in no acute distress.  ?Skin: Skin is warm and dry. No rash noted.   ?Cardiovascular: Normal heart rate noted  ?Respiratory: Normal respiratory effort, no problems with respiration noted  ?Abdomen: Soft, gravid, appropriate for gestational age.  Pain/Pressure: Absent     ?Pelvic: Cervical exam deferred        ?Extremities: Normal range of motion.  Edema: None  ?Mental Status: Normal mood and affect. Normal behavior. Normal judgment and thought content.  ? ?Assessment and Plan:  ?Pregnancy: [id number] 36w1d1. Supervision of high risk pregnancy, antepartum ?- Doing well, feeling regular and vigorous fetal movement  ?- Tdap vaccine greater than or equal to 7yo IM ? ?2. [redacted] weeks gestation of pregnancy ?- Routine OB care  ?- Pt still planning homebirth if no complications from her cHTN and A1GDM. Strongly desires to avoid IOL if  possible. ?- Counseled about increased risks associated with later term pregnancies with multiple co-morbidities and how to avoid these by keeping BP and glucose within normal range. Pt understanding. ? ?3. Diet controlled gestational diabetes mellitus (GDM) in third trimester ?- has appt with RD tomorrow, working on getting supplies from her insurance company ? ?4. Chronic hypertension affecting pregnancy ?- Stable on '30mg'$  procardia daily and baby aspirin ? ?5. Positive depression screening ?- Feeling anxious about diagnoses, declines BH referral ? ?Preterm labor symptoms and general obstetric precautions including but not limited to vaginal bleeding, contractions, leaking of fluid and fetal movement were reviewed in detail with the patient. ?Please refer to After Visit Summary for other counseling recommendations.  ? ?Return in about 2 weeks (around 08/27/2021) for IN-PERSON, LOB. ? ?Future Appointments  ?Date Time Provider [place]?08/20/2021  9:15 AM WMC-MFC NURSE WMC-MFC WMC  ?08/20/2021  9:30 AM WMC-MFC US2 WMC-MFCUS WMC  ?08/27/2021 10:15 AM WaGabriel CarinaCNM WM[facility]ML[facility]?08/27/2021 11:15 AM WMC-MFC NURSE WMC-MFC WMC  ?08/27/2021 11:30 AM WMC-MFC US3 WMC-MFCUS WMC  ?09/03/2021 11:15 AM WMC-MFC NURSE WMC-MFC WMC  ?09/03/2021 11:30 AM WMC-MFC US3 WMC-MFCUS WMCorrectionville?09/10/2021 10:15 AM WaGabriel CarinaCNM [facility]MOlymp[facility] CenterMC[facility]?09/17/2021  1:15 PM WaGabriel CarinaCNM WM[facility]?09/24/2021 10:35 AM WaGabriel CarinaCNM WMOil [facility] Surgical PlazaM[facility]?10/01/2021 10:35 AM WaGabriel CarinaCNM WMC-CWH WM[facility]? ? ?JaGabriel CarinaCNM ?

## 2021-08-20 ENCOUNTER — Ambulatory Visit: Payer: BC Managed Care – PPO | Attending: Maternal & Fetal Medicine

## 2021-08-20 ENCOUNTER — Ambulatory Visit: Payer: BC Managed Care – PPO

## 2021-08-24 ENCOUNTER — Encounter: Payer: Self-pay | Admitting: Certified Nurse Midwife

## 2021-08-26 DIAGNOSIS — Z8632 Personal history of gestational diabetes: Secondary | ICD-10-CM | POA: Insufficient documentation

## 2021-08-26 DIAGNOSIS — O24419 Gestational diabetes mellitus in pregnancy, unspecified control: Secondary | ICD-10-CM | POA: Insufficient documentation

## 2021-08-27 ENCOUNTER — Telehealth (INDEPENDENT_AMBULATORY_CARE_PROVIDER_SITE_OTHER): Payer: BC Managed Care – PPO | Admitting: Certified Nurse Midwife

## 2021-08-27 ENCOUNTER — Ambulatory Visit: Payer: BC Managed Care – PPO | Admitting: *Deleted

## 2021-08-27 ENCOUNTER — Encounter: Payer: Self-pay | Admitting: *Deleted

## 2021-08-27 ENCOUNTER — Other Ambulatory Visit: Payer: Self-pay | Admitting: *Deleted

## 2021-08-27 ENCOUNTER — Ambulatory Visit: Payer: BC Managed Care – PPO | Attending: Maternal & Fetal Medicine

## 2021-08-27 VITALS — BP 129/77 | HR 92

## 2021-08-27 VITALS — BP 125/82 | HR 89

## 2021-08-27 DIAGNOSIS — O24410 Gestational diabetes mellitus in pregnancy, diet controlled: Secondary | ICD-10-CM

## 2021-08-27 DIAGNOSIS — O24419 Gestational diabetes mellitus in pregnancy, unspecified control: Secondary | ICD-10-CM

## 2021-08-27 DIAGNOSIS — Z3689 Encounter for other specified antenatal screening: Secondary | ICD-10-CM | POA: Diagnosis not present

## 2021-08-27 DIAGNOSIS — O10913 Unspecified pre-existing hypertension complicating pregnancy, third trimester: Secondary | ICD-10-CM

## 2021-08-27 DIAGNOSIS — O09523 Supervision of elderly multigravida, third trimester: Secondary | ICD-10-CM | POA: Insufficient documentation

## 2021-08-27 DIAGNOSIS — O99213 Obesity complicating pregnancy, third trimester: Secondary | ICD-10-CM

## 2021-08-27 DIAGNOSIS — O281 Abnormal biochemical finding on antenatal screening of mother: Secondary | ICD-10-CM

## 2021-08-27 DIAGNOSIS — O10919 Unspecified pre-existing hypertension complicating pregnancy, unspecified trimester: Secondary | ICD-10-CM

## 2021-08-27 DIAGNOSIS — Z6841 Body Mass Index (BMI) 40.0 and over, adult: Secondary | ICD-10-CM | POA: Diagnosis not present

## 2021-08-27 DIAGNOSIS — O0990 Supervision of high risk pregnancy, unspecified, unspecified trimester: Secondary | ICD-10-CM | POA: Insufficient documentation

## 2021-08-27 DIAGNOSIS — O34219 Maternal care for unspecified type scar from previous cesarean delivery: Secondary | ICD-10-CM

## 2021-08-27 DIAGNOSIS — O10013 Pre-existing essential hypertension complicating pregnancy, third trimester: Secondary | ICD-10-CM | POA: Diagnosis not present

## 2021-08-27 DIAGNOSIS — Z3A32 32 weeks gestation of pregnancy: Secondary | ICD-10-CM

## 2021-08-27 DIAGNOSIS — O0993 Supervision of high risk pregnancy, unspecified, third trimester: Secondary | ICD-10-CM

## 2021-08-27 DIAGNOSIS — E669 Obesity, unspecified: Secondary | ICD-10-CM

## 2021-08-27 NOTE — Progress Notes (Signed)
OBSTETRICS PRENATAL VIRTUAL VISIT ENCOUNTER NOTE  Provider location: [facility] [place] [facility]   Patient location: Home  I connected with Janet Jacobs on 08/27/21 at 10:15 AM EDT by MyChart Video Encounter and verified that I am speaking with the correct person using two identifiers. I discussed the limitations, risks, security and privacy concerns of performing an evaluation and management service virtually and the availability of in person appointments. I also discussed with the patient that there may be a patient responsible charge related to this service. The patient expressed understanding and agreed to proceed. Subjective:  Janet Jacobs is a 41 y.o. [phone] at 11w5dbeing seen today for ongoing prenatal care.  She is currently monitored for the following issues for this high-risk pregnancy and has Latex allergy; BMI 45.0-49.9, adult (HL[place]; AMA (advanced maternal age) multigravida 347+ Chronic hypertension affecting pregnancy; Chronic hepatitis B without delta agent without cirrhosis (HS[place]; Cystic hygroma; Supervision of high risk pregnancy, antepartum; and Gestational diabetes on their problem list.  Patient reports no complaints.  Contractions: Irritability. Vag. Bleeding: None.  Movement: Present. Denies any leaking of fluid.   The following portions of the patient's history were reviewed and updated as appropriate: allergies, current medications, past family history, past medical history, past social history, past surgical history and problem list.   Objective:   Vitals:   08/27/21 1008  BP: 129/77  Pulse: 92   Fetal Status:     Movement: Present     General:  Alert, oriented and cooperative. Patient is in no acute distress.  Respiratory: Normal respiratory effort, no problems with respiration noted  Mental Status: Normal mood and affect. Normal behavior. Normal judgment and thought content.  Rest of physical exam deferred due to  type of encounter  Imaging: No results found.  Assessment and Plan:  Pregnancy: [id number] [redacted]w[redacted]d. Supervision of high risk pregnancy, antepartum - Doing well, feeling regular and vigorous fetal movement   2. [redacted] weeks gestation of pregnancy - Routine OB care   3. Chronic hypertension affecting pregnancy - Stable on procardia and aspirin  4. Diet controlled gestational diabetes mellitus (GDM) in third trimester - Has not been to diabetic education due to cost ($60/class), but she has researched the glucose range for GDM, has the supplies and her father (who has DM) has taught her how to take her blood sugar. - Reviewed the need for fastings at >92 and post-prandials >120. Will review log at next appt.  Preterm labor symptoms and general obstetric precautions including but not limited to vaginal bleeding, contractions, leaking of fluid and fetal movement were reviewed in detail with the patient. I discussed the assessment and treatment plan with the patient. The patient was provided an opportunity to ask questions and all were answered. The patient agreed with the plan and demonstrated an understanding of the instructions. The patient was advised to call back or seek an in-person office evaluation/[facility] any urgent or concerning symptoms. Please refer to After Visit Summary for other counseling recommendations.   I provided 10 minutes of face-to-face time during this encounter.  Return in about 2 weeks (around 09/10/2021) for VIRTUAL, LOB.  Future Appointments  Date Time Provider [place]5/24/2023 11:15 AM WMC-MFC NURSE [facility]MS[facility]5/24/2023 11:30 AM WMC-MFC US3 WMC[facility]MFCUS [facility]5/31/2023 11:15 AM WMC-MFC NURSE WMC-MFC [facility]5/31/2023 11:30 AM WMC-MFC US3 WMC-MFCUS [facility]MO[facility]6/10/2021 10:15 AM Caitlinn Klinker,  Melton Krebs [facility]  09/17/2021  1:15 PM Helaine Chess [facility]  09/24/2021 10:35 AM Gabriel Carina, CNM [facility]   10/01/2021 10:35 AM Gabriel Carina, CNM WMC-CWH [facility]    Gabriel Carina, [facility] [place]

## 2021-08-27 NOTE — Progress Notes (Signed)
I connected with  Janet Jacobs on 08/27/21 at 10:15 AM EDT by MyChart Virtual Video Visit and verified that I am speaking with the correct person using two identifiers.   I discussed the limitations, risks, security and privacy concerns of performing an evaluation and management service by telephone and the availability of in person appointments. I also discussed with the patient that there may be a patient responsible charge related to this service. The patient expressed understanding and agreed to proceed.  Mena Goes, CMA 08/27/2021  10:05 AM

## 2021-08-28 DIAGNOSIS — Z630 Problems in relationship with spouse or partner: Secondary | ICD-10-CM | POA: Diagnosis not present

## 2021-08-28 DIAGNOSIS — F4322 Adjustment disorder with anxiety: Secondary | ICD-10-CM | POA: Diagnosis not present

## 2021-09-02 DIAGNOSIS — F4322 Adjustment disorder with anxiety: Secondary | ICD-10-CM | POA: Diagnosis not present

## 2021-09-03 ENCOUNTER — Ambulatory Visit: Payer: BC Managed Care – PPO

## 2021-09-06 ENCOUNTER — Encounter: Payer: Self-pay | Admitting: Certified Nurse Midwife

## 2021-09-09 ENCOUNTER — Ambulatory Visit: Payer: BC Managed Care – PPO | Attending: Maternal & Fetal Medicine

## 2021-09-09 ENCOUNTER — Ambulatory Visit: Payer: BC Managed Care – PPO | Admitting: *Deleted

## 2021-09-09 VITALS — BP 135/85 | HR 92

## 2021-09-09 DIAGNOSIS — O09523 Supervision of elderly multigravida, third trimester: Secondary | ICD-10-CM | POA: Insufficient documentation

## 2021-09-09 DIAGNOSIS — O24419 Gestational diabetes mellitus in pregnancy, unspecified control: Secondary | ICD-10-CM

## 2021-09-09 DIAGNOSIS — O24410 Gestational diabetes mellitus in pregnancy, diet controlled: Secondary | ICD-10-CM | POA: Diagnosis not present

## 2021-09-09 DIAGNOSIS — O10013 Pre-existing essential hypertension complicating pregnancy, third trimester: Secondary | ICD-10-CM

## 2021-09-09 DIAGNOSIS — O10919 Unspecified pre-existing hypertension complicating pregnancy, unspecified trimester: Secondary | ICD-10-CM | POA: Diagnosis not present

## 2021-09-09 DIAGNOSIS — O0990 Supervision of high risk pregnancy, unspecified, unspecified trimester: Secondary | ICD-10-CM | POA: Diagnosis not present

## 2021-09-09 DIAGNOSIS — Z3689 Encounter for other specified antenatal screening: Secondary | ICD-10-CM | POA: Insufficient documentation

## 2021-09-09 DIAGNOSIS — O34219 Maternal care for unspecified type scar from previous cesarean delivery: Secondary | ICD-10-CM

## 2021-09-09 DIAGNOSIS — O281 Abnormal biochemical finding on antenatal screening of mother: Secondary | ICD-10-CM

## 2021-09-09 DIAGNOSIS — E669 Obesity, unspecified: Secondary | ICD-10-CM

## 2021-09-09 DIAGNOSIS — Z6841 Body Mass Index (BMI) 40.0 and over, adult: Secondary | ICD-10-CM | POA: Diagnosis not present

## 2021-09-09 DIAGNOSIS — Z3A34 34 weeks gestation of pregnancy: Secondary | ICD-10-CM

## 2021-09-09 DIAGNOSIS — O99213 Obesity complicating pregnancy, third trimester: Secondary | ICD-10-CM

## 2021-09-10 ENCOUNTER — Encounter: Payer: Self-pay | Admitting: Certified Nurse Midwife

## 2021-09-10 ENCOUNTER — Telehealth (INDEPENDENT_AMBULATORY_CARE_PROVIDER_SITE_OTHER): Payer: BC Managed Care – PPO | Admitting: Certified Nurse Midwife

## 2021-09-10 VITALS — BP 137/88 | HR 86

## 2021-09-10 DIAGNOSIS — O10919 Unspecified pre-existing hypertension complicating pregnancy, unspecified trimester: Secondary | ICD-10-CM

## 2021-09-10 DIAGNOSIS — O24410 Gestational diabetes mellitus in pregnancy, diet controlled: Secondary | ICD-10-CM

## 2021-09-10 DIAGNOSIS — O09523 Supervision of elderly multigravida, third trimester: Secondary | ICD-10-CM

## 2021-09-10 DIAGNOSIS — Z3A34 34 weeks gestation of pregnancy: Secondary | ICD-10-CM

## 2021-09-10 DIAGNOSIS — O0993 Supervision of high risk pregnancy, unspecified, third trimester: Secondary | ICD-10-CM

## 2021-09-10 NOTE — Progress Notes (Signed)
I connected with  Janet Jacobs on 09/10/21 at 10:35 AM EDT by MyChart and verified that I am speaking with the correct person using two identifiers.   I discussed the limitations, risks, security and privacy concerns of performing an evaluation and management service by telephone and the availability of in person appointments. I also discussed with the patient that there may be a patient responsible charge related to this service. The patient expressed understanding and agreed to proceed.  Annabell Howells, RN 09/10/2021  10:38 AM

## 2021-09-11 MED ORDER — METFORMIN HCL 500 MG PO TABS: 500.0000 mg | ORAL_TABLET | Freq: Every day | ORAL | 0 refills | Status: DC

## 2021-09-11 NOTE — Progress Notes (Signed)
OBSTETRICS PRENATAL VIRTUAL VISIT ENCOUNTER NOTE  Provider location: [facility]   Patient location: Home  I connected with Janet Jacobs on 09/11/21 at 10:35 AM EDT by MyChart Video Encounter and verified that I am speaking with the correct person using two identifiers. I discussed the limitations, risks, security and privacy concerns of performing an evaluation and management service virtually and the availability of in person appointments. I also discussed with the patient that there may be a patient responsible charge related to this service. The patient expressed understanding and agreed to proceed. Subjective:  Janet Jacobs is a 41 y.o. [id number] at 64w6dbeing seen today for ongoing prenatal care.  She is currently monitored for the following issues for this high-risk pregnancy and has Latex allergy; BMI 45.0-49.9, [place]; AMA (advanced maternal age) multigravida 385+ Chronic hypertension affecting pregnancy; Chronic hepatitis B without delta agent without [facility] ([facility]; Cystic hygroma; Supervision of high risk pregnancy, antepartum; and Gestational diabetes on their problem list.  Patient reports no complaints.  Contractions: Irritability. Vag. Bleeding: None.  Movement: Present. Denies any leaking of fluid.   The following portions of the patient's history were reviewed and updated as appropriate: allergies, current medications, past family history, past medical history, past social history, past surgical history and problem list.   Objective:   Vitals:   09/10/21 1038  BP: 137/88  Pulse: 86    Fetal Status:     Movement: Present     General:  Alert, oriented and cooperative. Patient is in no acute distress.  Respiratory: Normal respiratory effort, no problems with respiration noted  Mental Status: Normal mood and affect. Normal behavior. Normal judgment and thought content.  Rest of physical exam deferred due to  type of encounter  Imaging: [place]FM FETAL BPP WO NON STRESS  Result Date: 09/09/2021 ----------------------------------------------------------------------  OBSTETRICS REPORT                       (Signed Final 09/09/2021 04:09 pm) ---------------------------------------------------------------------- Patient Info  ID #:       [id number]                         D.O.B.:  [birth year](40 yrs)  Name:       Janet Jacobs                   Visit Date: 09/09/2021 03:40 pm              Janet Jacobs ---------------------------------------------------------------------- Performed By  Attending:        VJohnell ComingsMD         [facility]  Performed By:     JGeorgie Chard       [facility]  Referred By:      Darlina Rumpf ---------------------------------------------------------------------- Orders  #  Description                           Code        Ordered By  1  [place] MFM FETAL BPP WO NON               76819.01    CORENTHIAN     STRESS                                            BOOKER ----------------------------------------------------------------------  #  Order #                     Accession #                Episode #  1  [id number]                   [id number]                 [id number] ---------------------------------------------------------------------- Indications  Hypertension - Chronic/Pre-existing            O10.019  (procardia)  Obesity complicating pregnancy, third          O99.213  trimester  Advanced maternal age multigravida 46+,        O18.523  third trimester  [redacted] weeks gestation of pregnancy                Z3A.34  Abnormal biochemical screen (high risk due     O28.9  to fetal DNA fraction 2.5%)  Gestational diabetes in pregnancy,              O24.419  unspecified control  History of cesarean delivery, currently        O34.219  pregnant  Negative Horizon ---------------------------------------------------------------------- Fetal Evaluation  Num Of Fetuses:         1  Fetal Heart Rate(bpm):  153  Cardiac Activity:       Observed  Presentation:           Cephalic  Placenta:               Posterior  P. Cord Insertion:      Visualized  Amniotic Fluid  AFI FV:      Within normal limits  AFI Sum(cm)     %Tile       Largest Pocket(cm)  10.65           25          3.69  RUQ(cm)       RLQ(cm)       LUQ(cm)        LLQ(cm)  2.76          3.69  2.99           1.21 ---------------------------------------------------------------------- Biophysical Evaluation  Amniotic F.V:   Within normal limits       F. Tone:        Observed  F. Movement:    Observed                   Score:          8/8  F. Breathing:   Observed ---------------------------------------------------------------------- OB History  Blood Type:   A+  Gravidity:    6         Term:   3         SAB:   2  Living:       3 ---------------------------------------------------------------------- Gestational Age  LMP:           34w 4d        Date:  01/10/21                 EDD:   10/17/21  Best:          34w 4d     Det. By:  LMP  (01/10/21)          EDD:   10/17/21 ---------------------------------------------------------------------- Comments  This patient was seen for a BPP due to maternal obesity with  a BMI of 52 and chronic hypertension treated with Procardia.  She denies any problems since her last exam.  A biophysical profile performed today was 8 out of 8.  There was normal amniotic fluid noted on today's ultrasound  exam.  She will return in 1 week for another BPP. ----------------------------------------------------------------------                   Janet Comings, MD Electronically Signed Final Report   09/09/2021 04:09 pm  ----------------------------------------------------------------------  [place] MFM FETAL BPP WO NON STRESS  Result Date: 08/27/2021 ----------------------------------------------------------------------  OBSTETRICS REPORT                       (Signed Final 08/27/2021 12:39 pm) ---------------------------------------------------------------------- Patient Info  ID #:       [id number]                          D.O.B.:  02/14/1981 (40 yrs)  Name:       Janet Jacobs                   Visit Date: 08/27/2021 11:51 am              Janet Jacobs ---------------------------------------------------------------------- Performed By  Attending:        Sander Nephew      Secondary Phy.:   [facility]                    MD  Performed By:     Germain Osgood            [facility]  Referred By:      Darlina Rumpf ---------------------------------------------------------------------- Orders  #  Description                           Code        Ordered By  1  [place] MFM FETAL BPP WO NON               76819.01    [place]  2  [place] MFM OB FOLLOW UP                   76816.01    Sander Nephew ----------------------------------------------------------------------  #  Order #                     Accession #                Episode #  1  [id number]                   [id number]                 [id number]  2  [id number]                   [id number]                 [id number] ---------------------------------------------------------------------- Indications  Hypertension - Chronic/Pre-existing            O10.019  (procardia)  Advanced maternal age multigravida 48+,         O61.522  second trimester (41 y.o)  Abnormal biochemical screen (high risk due     O28.9  to fetal DNA fraction 0.9%)  Obesity complicating pregnancy, second         O99.212  trimester (BMI 52)  Gestational diabetes in pregnancy,             O24.419  unspecified control  History of cesarean delivery, currently        O34.219  pregnant  Negative Horizon  [redacted] weeks gestation of pregnancy                Z3A.32 ---------------------------------------------------------------------- Fetal Evaluation  Num Of Fetuses:         1  Fetal Heart Rate(bpm):  150  Cardiac Activity:       Observed  Presentation:  Cephalic  Placenta:               Posterior  P. Cord Insertion:      Previously Visualized  Amniotic Fluid  AFI FV:      Within normal limits  AFI Sum(cm)     %Tile       Largest Pocket(cm)  8.35            4.9         4.84  RUQ(cm)       RLQ(cm)       LUQ(cm)        LLQ(cm)  3.51          0             4.84           0 ---------------------------------------------------------------------- Biophysical Evaluation  Amniotic F.V:   Within normal limits       F. Tone:        Observed  F. Movement:    Observed                   Score:          8/8  F. Breathing:   Observed ---------------------------------------------------------------------- Biometry  BPD:      80.6  mm     G. Age:  32w 3d         32  %    CI:        68.56   %    70 - 86                                                          FL/HC:      20.1   %    19.9 - 21.5  HC:      311.1  mm     G. Age:  34w 6d         68  %    HC/AC:      1.00        0.96 - 1.11  AC:       312   mm     G. Age:  35w 1d         97  %    FL/BPD:     77.5   %    71 - 87  FL:       62.5  mm     G. Age:  32w 3d         28  %    FL/AC:      20.0   %    20 - 24  HUM:      51.8  mm     G. Age:  30w 2d          6  %  Est. FW:    2343  gm      5 lb 3 oz     82  % ---------------------------------------------------------------------- OB History  Blood Type:   A+  Gravidity:    6          Term:   3         SAB:   2  Living:       3 ---------------------------------------------------------------------- Gestational Age  LMP:  32w 5d        Date:  01/10/21                 EDD:   10/17/21  U/S Today:     33w 5d                                        EDD:   10/10/21  Best:          32w 5d     Det. By:  LMP  (01/10/21)          EDD:   10/17/21 ---------------------------------------------------------------------- Anatomy  Cranium:               Appears normal         Aortic Arch:            Previously seen  Cavum:                 Appears normal         Ductal Arch:            Previously seen  Ventricles:            Appears normal         Diaphragm:              Appears normal  Choroid Plexus:        Previously seen        Stomach:                Appears normal, left                                                                        sided  Cerebellum:            Previously seen        Abdomen:                Appears normal  Posterior Fossa:       Previously seen        Abdominal Wall:         Previously seen  Nuchal Fold:           Not applicable (>76    Cord Vessels:           Previously seen                         wks GA)  Face:                  Orbits and profile     Kidneys:                Appear normal                         previously seen  Lips:                  Previously seen        Bladder:                Appears normal  Thoracic:              Previously seen        Spine:                  Previously seen  Heart:                 Appears normal         Upper Extremities:      Previously seen                         (4CH, axis, and                         situs)  RVOT:                  Previously seen        Lower Extremities:      Previously seen  LVOT:                  Appears normal  Other:  Fetus appears to be female. VC, 3VV, Nasal bone, Falx, Hands and          feet prev visualized. Technically difficult due to maternal habitus and          fetal position.  ---------------------------------------------------------------------- Impression  Follow up growth due to chronic hypertension and AMA  Normal interval growth with measurements consistent with  dates  Good fetal movement and amniotic fluid volume  Biophysical profile 8/8 ---------------------------------------------------------------------- Recommendations  Continue weekly testing given CHTN.  Repeat growth in 4 weeks. ----------------------------------------------------------------------               Sander Nephew, MD Electronically Signed Final Report   08/27/2021 12:39 pm ----------------------------------------------------------------------  [place] MFM OB FOLLOW UP  Result Date: 08/27/2021 ----------------------------------------------------------------------  OBSTETRICS REPORT                       (Signed Final 08/27/2021 12:39 pm) ---------------------------------------------------------------------- Patient Info  ID #:       [id number]                          D.O.B.:  09/27/1980 (40 yrs)  Name:       Janet Jacobs                   Visit Date: 08/27/2021 11:51 am              Janet Jacobs ---------------------------------------------------------------------- Performed By  Attending:        Sander Nephew      Secondary Phy.:   [facility]                    MD  Performed By:     Germain Osgood            [facility]  Referred By:      Darlina Rumpf ---------------------------------------------------------------------- Orders  #  Description                           Code        Ordered By  1  [place] MFM FETAL BPP WO NON               76819.01    [place]  2  [place] MFM OB FOLLOW UP                    76816.01    Sander Nephew ----------------------------------------------------------------------  #  Order #                     Accession #                Episode #  1  [id number]                   [id number]                 [id number]  2  [id number]                   [id number]                 [id number] ---------------------------------------------------------------------- Indications  Hypertension - Chronic/Pre-existing            O10.019  (procardia)  Advanced maternal age multigravida 69+,        O39.522  second trimester (41 y.o)  Abnormal biochemical screen (high risk due     O28.9  to fetal DNA fraction 1.8%)  Obesity complicating pregnancy, second         O99.212  trimester (BMI 52)  Gestational diabetes in pregnancy,             O24.419  unspecified control  History of cesarean delivery, currently        O34.219  pregnant  Negative Horizon  [redacted] weeks gestation of pregnancy                Z3A.32 ---------------------------------------------------------------------- Fetal Evaluation  Num Of Fetuses:         1  Fetal Heart Rate(bpm):  150  Cardiac Activity:       Observed  Presentation:  Cephalic  Placenta:               Posterior  P. Cord Insertion:      Previously Visualized  Amniotic Fluid  AFI FV:      Within normal limits  AFI Sum(cm)     %Tile       Largest Pocket(cm)  8.35            4.9         4.84  RUQ(cm)       RLQ(cm)       LUQ(cm)        LLQ(cm)  3.51          0             4.84           0 ---------------------------------------------------------------------- Biophysical Evaluation  Amniotic F.V:   Within normal limits       F. Tone:        Observed  F. Movement:    Observed                   Score:          8/8  F. Breathing:   Observed ---------------------------------------------------------------------- Biometry  BPD:      80.6  mm     G. Age:  32w 3d         32  %    CI:        68.56   %    70 - 86                                                           FL/HC:      20.1   %    19.9 - 21.5  HC:      311.1  mm     G. Age:  34w 6d         68  %    HC/AC:      1.00        0.96 - 1.11  AC:       312   mm     G. Age:  35w 1d         97  %    FL/BPD:     77.5   %    71 - 87  FL:       62.5  mm     G. Age:  32w 3d         28  %    FL/AC:      20.0   %    20 - 24  HUM:      51.8  mm     G. Age:  30w 2d          6  %  Est. FW:    2343  gm      5 lb 3 oz     82  % ---------------------------------------------------------------------- OB History  Blood Type:   A+  Gravidity:    6         Term:   3         SAB:   2  Living:       3 ---------------------------------------------------------------------- Gestational Age  LMP:  32w 5d        Date:  01/10/21                 EDD:   10/17/21  U/S Today:     33w 5d                                        EDD:   10/10/21  Best:          32w 5d     Det. By:  LMP  (01/10/21)          EDD:   10/17/21 ---------------------------------------------------------------------- Anatomy  Cranium:               Appears normal         Aortic Arch:            Previously seen  Cavum:                 Appears normal         Ductal Arch:            Previously seen  Ventricles:            Appears normal         Diaphragm:              Appears normal  Choroid Plexus:        Previously seen        Stomach:                Appears normal, left                                                                        sided  Cerebellum:            Previously seen        Abdomen:                Appears normal  Posterior Fossa:       Previously seen        Abdominal Wall:         Previously seen  Nuchal Fold:           Not applicable (>33    Cord Vessels:           Previously seen                         wks GA)  Face:                  Orbits and profile     Kidneys:                Appear normal                         previously seen  Lips:                  Previously seen        Bladder:                Appears normal  Thoracic:              Previously seen        Spine:                  Previously seen  Heart:                 Appears normal         Upper Extremities:      Previously seen                         (4CH, axis, and                         situs)  RVOT:                  Previously seen        Lower Extremities:      Previously seen  LVOT:                  Appears normal  Other:  Fetus appears to be female. VC, 3VV, Nasal bone, Falx, Hands and          feet prev visualized. Technically difficult due to maternal habitus and          fetal position. ---------------------------------------------------------------------- Impression  Follow up growth due to chronic hypertension and AMA  Normal interval growth with measurements consistent with  dates  Good fetal movement and amniotic fluid volume  Biophysical profile 8/8 ---------------------------------------------------------------------- Recommendations  Continue weekly testing given CHTN.  Repeat growth in 4 weeks. ----------------------------------------------------------------------               Sander Nephew, MD Electronically Signed Final Report   08/27/2021 12:39 pm ----------------------------------------------------------------------   Assessment and Plan:  Pregnancy: [id number] at 26w6d1. Supervision of high risk pregnancy in third trimester - Doing well, feeling regular and vigorous fetal movement   2. [redacted] weeks gestation of pregnancy - Routine OB care   3. Chronic hypertension affecting pregnancy - BP today normotensive, but creeping toward borderline. On daily procardia and aspirin. No s/sx of preeclampsia  4. Diet controlled gestational diabetes mellitus (GDM) in third trimester - Review of available readings shows all fastings >99 and 3-4 post-prandials >120. Pt admits she has trouble with timing so often the pp readings are within the 2hr window. Also having trouble getting in all 4 readings each day with the workload she bears raising kids  and working. Encouraged her to keep shooting for 4 readings per day, regular meals that center on lean protein, colorful fruits/veg and a small amount of complex carbs.  - Emphasized importance of glucose control for risk reduction. Uncontrolled glucose can cause hypertension and thus preeclampsia, both of which increase her associated risk of placenta abruption or uterine rupture. - metFORMIN (GLUCOPHAGE) 500 MG tablet; Take 1 tablet (500 mg total) by mouth at bedtime.  Dispense: 60 tablet; Refill: 0  5. Multigravida of advanced maternal age in third trimester - Taking daily ASA - Pt still planning home delivery with CPM due to [facility]'s rules regarding VBAC and waterbirth. - Discussed increasing risks of homebirth given her current risk profile and strongly encouraged a hospital birth under midwifery care, since we would recommend a delivery via IOL at 37-39wks depending on her glucose and BP control. Pt to consider but still leaning heavily toward home delivery.  Preterm labor symptoms and general obstetric precautions  including but not limited to vaginal bleeding, contractions, leaking of fluid and fetal movement were reviewed in detail with the patient. I discussed the assessment and treatment plan with the patient. The patient was provided an opportunity to ask questions and all were answered. The patient agreed with the plan and demonstrated an understanding of the instructions. The patient was advised to call back or seek an in-person office evaluation/go to MAU at [facility] for any urgent or concerning symptoms. Please refer to After Visit Summary for other counseling recommendations.   I provided 15 minutes of face-to-face time during this encounter.  Return in about 2 weeks (around 09/24/2021) for HOB/GBS, IN-PERSON.  Future Appointments  Date Time Provider [place]  09/17/2021 10:30 AM [facility] NURSE [facility]  09/17/2021 10:45 AM [facility] US6 [facility]   09/17/2021  1:15 PM Gabriel Carina, CNM [facility]  09/24/2021 10:35 AM Gabriel Carina, CNM [facility]  09/26/2021  9:30 AM [facility] NURSE [facility]  09/26/2021  9:45 AM [facility] US4 [facility]  10/01/2021 10:35 AM Gabriel Carina, CNM [facility]  10/08/2021  3:35 PM Gabriel Carina, CNM [facility]  10/16/2021  3:35 PM [place]oo[place], Mervyn Skeeters, CNM [facility]    Gabriel Carina, [place] for [employer], [place]

## 2021-09-15 DIAGNOSIS — M5489 Other dorsalgia: Secondary | ICD-10-CM | POA: Diagnosis not present

## 2021-09-16 DIAGNOSIS — F4322 Adjustment disorder with anxiety: Secondary | ICD-10-CM | POA: Diagnosis not present

## 2021-09-17 ENCOUNTER — Ambulatory Visit (INDEPENDENT_AMBULATORY_CARE_PROVIDER_SITE_OTHER): Payer: BC Managed Care – PPO | Admitting: Certified Nurse Midwife

## 2021-09-17 ENCOUNTER — Ambulatory Visit: Payer: BC Managed Care – PPO | Attending: Maternal & Fetal Medicine

## 2021-09-17 ENCOUNTER — Ambulatory Visit: Payer: BC Managed Care – PPO | Admitting: *Deleted

## 2021-09-17 ENCOUNTER — Other Ambulatory Visit: Payer: Self-pay | Admitting: *Deleted

## 2021-09-17 VITALS — BP 135/80 | HR 87 | Wt 310.0 lb

## 2021-09-17 VITALS — BP 143/84 | HR 97

## 2021-09-17 DIAGNOSIS — O09523 Supervision of elderly multigravida, third trimester: Secondary | ICD-10-CM

## 2021-09-17 DIAGNOSIS — O10013 Pre-existing essential hypertension complicating pregnancy, third trimester: Secondary | ICD-10-CM | POA: Diagnosis not present

## 2021-09-17 DIAGNOSIS — O10913 Unspecified pre-existing hypertension complicating pregnancy, third trimester: Secondary | ICD-10-CM

## 2021-09-17 DIAGNOSIS — O10919 Unspecified pre-existing hypertension complicating pregnancy, unspecified trimester: Secondary | ICD-10-CM

## 2021-09-17 DIAGNOSIS — O24415 Gestational diabetes mellitus in pregnancy, controlled by oral hypoglycemic drugs: Secondary | ICD-10-CM | POA: Insufficient documentation

## 2021-09-17 DIAGNOSIS — O99213 Obesity complicating pregnancy, third trimester: Secondary | ICD-10-CM

## 2021-09-17 DIAGNOSIS — Z3A35 35 weeks gestation of pregnancy: Secondary | ICD-10-CM

## 2021-09-17 DIAGNOSIS — O0990 Supervision of high risk pregnancy, unspecified, unspecified trimester: Secondary | ICD-10-CM

## 2021-09-17 DIAGNOSIS — O24410 Gestational diabetes mellitus in pregnancy, diet controlled: Secondary | ICD-10-CM | POA: Diagnosis not present

## 2021-09-17 DIAGNOSIS — E669 Obesity, unspecified: Secondary | ICD-10-CM | POA: Diagnosis not present

## 2021-09-17 DIAGNOSIS — O289 Unspecified abnormal findings on antenatal screening of mother: Secondary | ICD-10-CM | POA: Diagnosis not present

## 2021-09-17 DIAGNOSIS — O34219 Maternal care for unspecified type scar from previous cesarean delivery: Secondary | ICD-10-CM

## 2021-09-17 DIAGNOSIS — O0993 Supervision of high risk pregnancy, unspecified, third trimester: Secondary | ICD-10-CM

## 2021-09-17 NOTE — Progress Notes (Signed)
PRENATAL VISIT NOTE  Subjective:  Janet Jacobs is a 41 y.o. [phone] at 2w5dbeing seen today for ongoing prenatal care.  She is currently monitored for the following issues for this high-risk pregnancy and has Latex allergy; BMI 45.0-49.9, adult ([place]; AMA (advanced maternal age) multigravida 327+ Chronic hypertension affecting pregnancy; Chronic hepatitis B without delta agent without cirrhosis ([place]; Cystic hygroma; Supervision of high risk pregnancy, antepartum; and Gestational diabetes on their problem list.  Patient reports no complaints.  Contractions: Irritability. Vag. Bleeding: None.  Movement: Present. Denies leaking of fluid.   The following portions of the patient's history were reviewed and updated as appropriate: allergies, current medications, past family history, past medical history, past social history, past surgical history and problem list.   Objective:   Vitals:   09/17/21 1328  BP: 135/80  Pulse: 87  Weight: (!) 310 lb (140.6 kg)    Fetal Status: Fetal Heart Rate (bpm): 149   Movement: Present     General:  Alert, oriented and cooperative. Patient is in no acute distress.  Skin: Skin is warm and dry. No rash noted.   Cardiovascular: Normal heart rate noted  Respiratory: Normal respiratory effort, no problems with respiration noted  Abdomen: Soft, gravid, appropriate for gestational age.  Pain/Pressure: Present     Pelvic: Cervical exam deferred        Extremities: Normal range of motion.  Edema: Trace  Mental Status: Normal mood and affect. Normal behavior. Normal judgment and thought content.   Assessment and Plan:  Pregnancy: [id number] 355w5d. Supervision of high risk pregnancy in third trimester - Doing well, feeling regular and vigorous fetal movement   2. [redacted] weeks gestation of pregnancy - Routine OB care including anticipatory guidance re GBS swab at next visit  3. Chronic hypertension affecting pregnancy - Had a hypertensive BP at MFM  this morning but normal today (and under stress at today's appt due to car trouble)  4. Gestational diabetes mellitus (GDM) in third trimester controlled on oral hypoglycemic drug - Has not started Metformin yet due to pharmacy issues. Picked up today and will take first dose tonight. After review of glucose log, instructed her to take one nightly dose for two days then increase to '500mg'$  twice daily. - Glucose log shows all fastings remain out of range and >50% of post-prandials out of range (highest 165 but that was within an hour of eating). Pt admits timing of post-prandials are not always a full two hours later. Having trouble with timing of eating/testing. - Diet log from today: Noosa yogurt, peanut butter toast and a protein rich lunch (with little carb). Reviewed dietary needs for GDM: eating within an hour of waking then q3hrs until bedtime. Meals/snacks should be protein rich, plant based fats, low glycemic fruits/veg and complex carbs.  - Strongly recommended IOL at 37wks (38-39wks if glucose well controlled going forward and no additional BP increase). FrPilar Plateiscussion on risks of going further and potential dangers of a homebirth with GDM/cHTN and the poor glucose control she has at the moment.  - Reviewed IOL methods for VBAC and how best to avoid CS.  - Pt agreed to discuss with her husband and discuss scheduling of IOL at next visit  Preterm labor symptoms and general obstetric precautions including but not limited to vaginal bleeding, contractions, leaking of fluid and fetal movement were reviewed in detail with the patient. Please refer to After Visit Summary for other counseling recommendations.   Return  in about 1 week (around 09/24/2021) for IN-PERSON, HOB/GBS.  Future Appointments  Date Time Provider [place]  09/24/2021 10:35 AM Helaine Chess [facility]  09/26/2021  9:30 AM [facility] NURSE [facility]  09/26/2021  9:45 AM [facility] US4 [facility]  09/30/2021  10:30 AM [facility] NURSE [facility]  09/30/2021 10:45 AM [facility] US4 [facility]  10/01/2021 10:35 AM Helaine Chess [facility]  10/08/2021  3:35 PM Gabriel Carina, CNM [facility]  10/16/2021  3:35 PM [place], Mervyn Skeeters, CNM [facility]    Gabriel Carina, CNM

## 2021-09-22 DIAGNOSIS — R609 Edema, unspecified: Secondary | ICD-10-CM | POA: Diagnosis not present

## 2021-09-24 ENCOUNTER — Ambulatory Visit (INDEPENDENT_AMBULATORY_CARE_PROVIDER_SITE_OTHER): Payer: BC Managed Care – PPO | Admitting: Certified Nurse Midwife

## 2021-09-24 ENCOUNTER — Other Ambulatory Visit: Payer: Self-pay

## 2021-09-24 DIAGNOSIS — O24415 Gestational diabetes mellitus in pregnancy, controlled by oral hypoglycemic drugs: Secondary | ICD-10-CM

## 2021-09-24 DIAGNOSIS — Z98891 History of uterine scar from previous surgery: Secondary | ICD-10-CM

## 2021-09-24 DIAGNOSIS — O10919 Unspecified pre-existing hypertension complicating pregnancy, unspecified trimester: Secondary | ICD-10-CM

## 2021-09-24 DIAGNOSIS — O0993 Supervision of high risk pregnancy, unspecified, third trimester: Secondary | ICD-10-CM

## 2021-09-24 DIAGNOSIS — Z3A36 36 weeks gestation of pregnancy: Secondary | ICD-10-CM

## 2021-09-24 NOTE — Progress Notes (Signed)
Patient left without completing appt due to work schedule. RN called pt to establish plan for follow up. Pt agreeable to nurse visit Friday, 09/26/21 at 9 AM prior to [place] for vaginal swabs (GBS, GC, CT) and BP check. No openings available on provider schedules before next scheduled appt with Gilford Rile, CNM on 10/01/21. Reviewed with patient that IOL was previously recommended at 38-39 weeks if glucose well controlled and no elevated BP and this will be discussed again with MFM at [place] appt 09/26/21. Encouraged pt to continue checking BP at home which she states has been normal at home recently. Encouraged pt to bring blood glucose log to [place] appt for review.   Apolonio Schneiders RN 09/24/21

## 2021-09-26 ENCOUNTER — Other Ambulatory Visit (HOSPITAL_COMMUNITY)
Admission: RE | Admit: 2021-09-26 | Discharge: 2021-09-26 | Disposition: A | Discharge status: Home / Self Care | Payer: BC Managed Care – PPO | Source: Ambulatory Visit | Attending: Family Medicine | Admitting: Family Medicine

## 2021-09-26 ENCOUNTER — Other Ambulatory Visit: Payer: Self-pay | Admitting: Maternal & Fetal Medicine

## 2021-09-26 ENCOUNTER — Encounter: Payer: Self-pay | Admitting: *Deleted

## 2021-09-26 ENCOUNTER — Ambulatory Visit (INDEPENDENT_AMBULATORY_CARE_PROVIDER_SITE_OTHER): Payer: BC Managed Care – PPO

## 2021-09-26 ENCOUNTER — Ambulatory Visit: Payer: BC Managed Care – PPO | Admitting: *Deleted

## 2021-09-26 ENCOUNTER — Other Ambulatory Visit: Payer: Self-pay

## 2021-09-26 ENCOUNTER — Ambulatory Visit (HOSPITAL_BASED_OUTPATIENT_CLINIC_OR_DEPARTMENT_OTHER): Payer: BC Managed Care – PPO

## 2021-09-26 VITALS — BP 133/75 | HR 98

## 2021-09-26 VITALS — BP 147/95 | HR 95 | Wt 306.5 lb

## 2021-09-26 DIAGNOSIS — O34219 Maternal care for unspecified type scar from previous cesarean delivery: Secondary | ICD-10-CM | POA: Insufficient documentation

## 2021-09-26 DIAGNOSIS — O10013 Pre-existing essential hypertension complicating pregnancy, third trimester: Secondary | ICD-10-CM

## 2021-09-26 DIAGNOSIS — Z3A37 37 weeks gestation of pregnancy: Secondary | ICD-10-CM | POA: Insufficient documentation

## 2021-09-26 DIAGNOSIS — O09523 Supervision of elderly multigravida, third trimester: Secondary | ICD-10-CM | POA: Diagnosis not present

## 2021-09-26 DIAGNOSIS — O24415 Gestational diabetes mellitus in pregnancy, controlled by oral hypoglycemic drugs: Secondary | ICD-10-CM

## 2021-09-26 DIAGNOSIS — Z6841 Body Mass Index (BMI) 40.0 and over, adult: Secondary | ICD-10-CM | POA: Insufficient documentation

## 2021-09-26 DIAGNOSIS — O0990 Supervision of high risk pregnancy, unspecified, unspecified trimester: Secondary | ICD-10-CM | POA: Insufficient documentation

## 2021-09-26 DIAGNOSIS — Z3689 Encounter for other specified antenatal screening: Secondary | ICD-10-CM

## 2021-09-26 DIAGNOSIS — O10919 Unspecified pre-existing hypertension complicating pregnancy, unspecified trimester: Secondary | ICD-10-CM

## 2021-09-26 DIAGNOSIS — O281 Abnormal biochemical finding on antenatal screening of mother: Secondary | ICD-10-CM

## 2021-09-26 DIAGNOSIS — O99213 Obesity complicating pregnancy, third trimester: Secondary | ICD-10-CM | POA: Diagnosis not present

## 2021-09-26 DIAGNOSIS — O10913 Unspecified pre-existing hypertension complicating pregnancy, third trimester: Secondary | ICD-10-CM | POA: Insufficient documentation

## 2021-09-26 DIAGNOSIS — E669 Obesity, unspecified: Secondary | ICD-10-CM

## 2021-09-29 LAB — CERVICOVAGINAL ANCILLARY ONLY
Chlamydia: NEGATIVE
Comment: NEGATIVE
Comment: NORMAL
Neisseria Gonorrhea: NEGATIVE

## 2021-09-30 ENCOUNTER — Ambulatory Visit: Payer: BC Managed Care – PPO | Admitting: *Deleted

## 2021-09-30 ENCOUNTER — Ambulatory Visit: Payer: BC Managed Care – PPO | Attending: Obstetrics

## 2021-09-30 ENCOUNTER — Encounter: Payer: Self-pay | Admitting: *Deleted

## 2021-09-30 VITALS — BP 142/90 | HR 92

## 2021-09-30 DIAGNOSIS — O0990 Supervision of high risk pregnancy, unspecified, unspecified trimester: Secondary | ICD-10-CM | POA: Insufficient documentation

## 2021-09-30 DIAGNOSIS — O10919 Unspecified pre-existing hypertension complicating pregnancy, unspecified trimester: Secondary | ICD-10-CM

## 2021-09-30 DIAGNOSIS — O24415 Gestational diabetes mellitus in pregnancy, controlled by oral hypoglycemic drugs: Secondary | ICD-10-CM

## 2021-09-30 DIAGNOSIS — O99213 Obesity complicating pregnancy, third trimester: Secondary | ICD-10-CM

## 2021-09-30 DIAGNOSIS — O09523 Supervision of elderly multigravida, third trimester: Secondary | ICD-10-CM | POA: Diagnosis not present

## 2021-09-30 DIAGNOSIS — O10913 Unspecified pre-existing hypertension complicating pregnancy, third trimester: Secondary | ICD-10-CM

## 2021-09-30 DIAGNOSIS — O3663X0 Maternal care for excessive fetal growth, third trimester, not applicable or unspecified: Secondary | ICD-10-CM | POA: Diagnosis not present

## 2021-09-30 DIAGNOSIS — O34219 Maternal care for unspecified type scar from previous cesarean delivery: Secondary | ICD-10-CM

## 2021-09-30 DIAGNOSIS — O10013 Pre-existing essential hypertension complicating pregnancy, third trimester: Secondary | ICD-10-CM

## 2021-09-30 DIAGNOSIS — E669 Obesity, unspecified: Secondary | ICD-10-CM

## 2021-09-30 DIAGNOSIS — O281 Abnormal biochemical finding on antenatal screening of mother: Secondary | ICD-10-CM

## 2021-09-30 DIAGNOSIS — Z3A37 37 weeks gestation of pregnancy: Secondary | ICD-10-CM

## 2021-10-01 ENCOUNTER — Encounter (HOSPITAL_COMMUNITY): Payer: Self-pay | Admitting: *Deleted

## 2021-10-01 ENCOUNTER — Ambulatory Visit (INDEPENDENT_AMBULATORY_CARE_PROVIDER_SITE_OTHER): Payer: BC Managed Care – PPO | Admitting: Certified Nurse Midwife

## 2021-10-01 ENCOUNTER — Telehealth (INDEPENDENT_AMBULATORY_CARE_PROVIDER_SITE_OTHER): Payer: BC Managed Care – PPO | Admitting: Certified Nurse Midwife

## 2021-10-01 ENCOUNTER — Telehealth (HOSPITAL_COMMUNITY): Payer: Self-pay | Admitting: *Deleted

## 2021-10-01 VITALS — BP 137/88

## 2021-10-01 DIAGNOSIS — O24415 Gestational diabetes mellitus in pregnancy, controlled by oral hypoglycemic drugs: Secondary | ICD-10-CM

## 2021-10-01 DIAGNOSIS — O0993 Supervision of high risk pregnancy, unspecified, third trimester: Secondary | ICD-10-CM

## 2021-10-01 DIAGNOSIS — O10919 Unspecified pre-existing hypertension complicating pregnancy, unspecified trimester: Secondary | ICD-10-CM

## 2021-10-01 DIAGNOSIS — Z3A37 37 weeks gestation of pregnancy: Secondary | ICD-10-CM

## 2021-10-01 DIAGNOSIS — O10913 Unspecified pre-existing hypertension complicating pregnancy, third trimester: Secondary | ICD-10-CM

## 2021-10-01 LAB — CULTURE, BETA STREP (GROUP B ONLY): Strep Gp B Culture: NEGATIVE

## 2021-10-01 NOTE — Telephone Encounter (Signed)
Preadmission screen  

## 2021-10-02 DIAGNOSIS — F4322 Adjustment disorder with anxiety: Secondary | ICD-10-CM | POA: Diagnosis not present

## 2021-10-02 DIAGNOSIS — Z630 Problems in relationship with spouse or partner: Secondary | ICD-10-CM | POA: Diagnosis not present

## 2021-10-02 NOTE — Progress Notes (Signed)
TELEHEALTH OBSTETRICS VISIT ENCOUNTER NOTE  Provider location: [facility] for [employer] [facility] [employer] [facility]   Patient location: [place] was scheduled for an in-person visit today but had to convert to virtual visit due to childcare constraints. Unable to do video encounter due to technical difficulties.   I connected with [name] on 10/02/21 at 11:15 AM EDT by telephone at home and verified that I am speaking with the correct person using two identifiers.    I discussed the limitations, risks, security and privacy concerns of performing an evaluation and management service by telephone and the availability of in person appointments. I also discussed with the patient that there may be a patient responsible charge related to this service. The patient expressed understanding and agreed to proceed.  Subjective:  Janet Jacobs is a 41 y.o. [phone] at 35w6dbeing followed for ongoing prenatal care.  She is currently monitored for the following issues for this high-risk pregnancy and has Latex allergy; BMI 45.0-49.9, adult ([place]; AMA (advanced maternal age) multigravida 359+ Chronic hypertension affecting pregnancy; Chronic hepatitis B without delta agent without cirrhosis ([place]; Cystic hygroma; Supervision of high risk pregnancy, antepartum; and Gestational diabetes on their problem list.  Patient reports no complaints. Reports fetal movement. Denies any contractions, bleeding or leaking of fluid.   The following portions of the patient's history were reviewed and updated as appropriate: allergies, current medications, past family history, past medical history, past social history, past surgical history and problem list.   Objective:  Last menstrual period 01/10/2021. General:  Alert, oriented and cooperative.   Mental Status: Normal mood and affect perceived. Normal judgment and thought content.  Rest of physical exam deferred due to type of  encounter  Assessment and Plan:  Pregnancy: [id number] [redacted]w[redacted]d. Supervision of high risk pregnancy in third trimester - Doing well, feeling regular and vigorous fetal movement  - Reviewed MFM's note suggesting IOL at 38wks for cHTN & A2GDM. Pt declines, strongly prefers to wait until 40wks unless NST/BPP shows fetal issues, she experiences DFM, BP consistently >140/90 or she loses control of glucose levels. - Pt agreeable to delivery in the hospital when she goes into labor or needs I[place] at [place]- IOL scheduled for [place] on 10/17/21  2. [redacted] weeks gestation of pregnancy - Routine OB care   3. Chronic hypertension affecting pregnancy - BP stable today  4. Gestational diabetes mellitus (GDM) in third trimester controlled on oral hypoglycemic drug - Fasting glucose now ranging 75-85, higher (below 95) if she has not slept well the night before - Post-prandials >120 IF she has gone the full 2hrs (which is difficult), if taken prior to a full 2hrs, ranging 130-140 (still within range)  Term labor symptoms and general obstetric precautions including but not limited to vaginal bleeding, contractions, leaking of fluid and fetal movement were reviewed in detail with the patient.  I discussed the assessment and treatment plan with the patient. The patient was provided an opportunity to ask questions and all were answered. The patient agreed with the plan and demonstrated an understanding of the instructions. The patient was advised to call back or seek an in-person office evaluation/[facility] any urgent or concerning symptoms. Please refer to After Visit Summary for other counseling recommendations.   I provided 10 minutes of non-face-to-face time during this encounter.  No follow-ups on file.  Future Appointments  Date Time  Provider [place]  10/08/2021  3:35 PM Helaine Chess [facility]  10/16/2021  3:35 PM Starr Lake, CNM [facility]  10/17/2021 12:00 AM [facility]LD [place] None    Gabriel Carina, [place] for [employer], [place]

## 2021-10-04 NOTE — Progress Notes (Signed)
Pt called in to cancel due to childcare constraints but agreed to a MyChart visit. See other encounter.

## 2021-10-08 ENCOUNTER — Encounter: Payer: Self-pay | Admitting: *Deleted

## 2021-10-08 ENCOUNTER — Ambulatory Visit (INDEPENDENT_AMBULATORY_CARE_PROVIDER_SITE_OTHER): Payer: BC Managed Care – PPO | Admitting: Certified Nurse Midwife

## 2021-10-08 ENCOUNTER — Encounter: Payer: Self-pay | Admitting: Certified Nurse Midwife

## 2021-10-08 ENCOUNTER — Other Ambulatory Visit: Payer: BC Managed Care – PPO

## 2021-10-08 DIAGNOSIS — O24415 Gestational diabetes mellitus in pregnancy, controlled by oral hypoglycemic drugs: Secondary | ICD-10-CM

## 2021-10-08 DIAGNOSIS — O0993 Supervision of high risk pregnancy, unspecified, third trimester: Secondary | ICD-10-CM

## 2021-10-08 DIAGNOSIS — O10919 Unspecified pre-existing hypertension complicating pregnancy, unspecified trimester: Secondary | ICD-10-CM

## 2021-10-08 DIAGNOSIS — Z3A38 38 weeks gestation of pregnancy: Secondary | ICD-10-CM

## 2021-10-08 NOTE — Telephone Encounter (Signed)
Spoke to patient re her cancellation of remaining appts. Pt expressed displeasure with coming to the office but was not willing to discuss details at this time. Is being followed by her homebirth midwife and assures me she will come to the hospital for evaluation and IOL if her BP is elevated, she has any signs of PEC, her blood sugar is not well controlled, any DFM, problems in labor or if she goes past 40wks (since IOL is scheduled at that time. Pt is fully aware of the increased risks associated with her diagnoses but wants to reduce her stress as she approaches the end of her pregnancy.   Gaylan Gerold, CNM, MSN, [place] Certified Nurse Midwife, [facility]

## 2021-10-09 ENCOUNTER — Other Ambulatory Visit: Payer: Self-pay | Admitting: Advanced Practice Midwife

## 2021-10-12 NOTE — Progress Notes (Signed)
Remaining visits canceled, see telephone encounter note.

## 2021-10-13 ENCOUNTER — Other Ambulatory Visit: Payer: BC Managed Care – PPO

## 2021-10-16 ENCOUNTER — Encounter: Payer: BC Managed Care – PPO | Admitting: Student

## 2021-10-16 DIAGNOSIS — F4322 Adjustment disorder with anxiety: Secondary | ICD-10-CM | POA: Diagnosis not present

## 2021-10-16 DIAGNOSIS — Z630 Problems in relationship with spouse or partner: Secondary | ICD-10-CM | POA: Diagnosis not present

## 2021-10-17 ENCOUNTER — Inpatient Hospital Stay (HOSPITAL_COMMUNITY): Payer: BC Managed Care – PPO | Attending: Family Medicine

## 2021-10-17 ENCOUNTER — Inpatient Hospital Stay (HOSPITAL_COMMUNITY): Admission: AD | Admit: 2021-10-17 | Payer: BC Managed Care – PPO | Source: Home / Self Care | Admitting: Family Medicine

## 2021-10-20 DIAGNOSIS — R102 Pelvic and perineal pain: Secondary | ICD-10-CM | POA: Diagnosis not present

## 2021-10-22 ENCOUNTER — Other Ambulatory Visit: Payer: Self-pay | Admitting: *Deleted

## 2021-10-22 DIAGNOSIS — O09523 Supervision of elderly multigravida, third trimester: Secondary | ICD-10-CM

## 2021-10-22 DIAGNOSIS — O24419 Gestational diabetes mellitus in pregnancy, unspecified control: Secondary | ICD-10-CM

## 2021-10-22 DIAGNOSIS — O480 Post-term pregnancy: Secondary | ICD-10-CM

## 2021-10-23 ENCOUNTER — Other Ambulatory Visit: Payer: Self-pay | Admitting: Maternal & Fetal Medicine

## 2021-10-23 ENCOUNTER — Encounter (HOSPITAL_COMMUNITY): Payer: Self-pay | Admitting: Obstetrics and Gynecology

## 2021-10-23 ENCOUNTER — Inpatient Hospital Stay (HOSPITAL_COMMUNITY)
Admission: AD | Admit: 2021-10-23 | Discharge: 2021-10-26 | DRG: 787 | Disposition: A | Discharge status: Home / Self Care | Payer: BC Managed Care – PPO | Attending: Obstetrics and Gynecology | Admitting: Obstetrics and Gynecology

## 2021-10-23 ENCOUNTER — Ambulatory Visit (HOSPITAL_BASED_OUTPATIENT_CLINIC_OR_DEPARTMENT_OTHER): Payer: BC Managed Care – PPO

## 2021-10-23 ENCOUNTER — Encounter (HOSPITAL_COMMUNITY)
Admission: AD | Disposition: A | Discharge status: Home / Self Care | Payer: Self-pay | Source: Home / Self Care | Attending: Obstetrics and Gynecology

## 2021-10-23 ENCOUNTER — Inpatient Hospital Stay (HOSPITAL_COMMUNITY): Payer: BC Managed Care – PPO | Admitting: Anesthesiology

## 2021-10-23 ENCOUNTER — Ambulatory Visit (HOSPITAL_BASED_OUTPATIENT_CLINIC_OR_DEPARTMENT_OTHER): Payer: BC Managed Care – PPO | Admitting: Maternal & Fetal Medicine

## 2021-10-23 ENCOUNTER — Other Ambulatory Visit: Payer: Self-pay

## 2021-10-23 ENCOUNTER — Ambulatory Visit: Payer: BC Managed Care – PPO | Admitting: *Deleted

## 2021-10-23 VITALS — BP 167/115 | HR 100

## 2021-10-23 DIAGNOSIS — O0990 Supervision of high risk pregnancy, unspecified, unspecified trimester: Secondary | ICD-10-CM

## 2021-10-23 DIAGNOSIS — O10919 Unspecified pre-existing hypertension complicating pregnancy, unspecified trimester: Secondary | ICD-10-CM

## 2021-10-23 DIAGNOSIS — O24425 Gestational diabetes mellitus in childbirth, controlled by oral hypoglycemic drugs: Secondary | ICD-10-CM | POA: Diagnosis not present

## 2021-10-23 DIAGNOSIS — Z23 Encounter for immunization: Secondary | ICD-10-CM | POA: Diagnosis not present

## 2021-10-23 DIAGNOSIS — O34219 Maternal care for unspecified type scar from previous cesarean delivery: Secondary | ICD-10-CM | POA: Diagnosis not present

## 2021-10-23 DIAGNOSIS — O24419 Gestational diabetes mellitus in pregnancy, unspecified control: Secondary | ICD-10-CM | POA: Diagnosis present

## 2021-10-23 DIAGNOSIS — O114 Pre-existing hypertension with pre-eclampsia, complicating childbirth: Principal | ICD-10-CM | POA: Diagnosis present

## 2021-10-23 DIAGNOSIS — O09523 Supervision of elderly multigravida, third trimester: Secondary | ICD-10-CM

## 2021-10-23 DIAGNOSIS — I10 Essential (primary) hypertension: Secondary | ICD-10-CM | POA: Diagnosis present

## 2021-10-23 DIAGNOSIS — B181 Chronic viral hepatitis B without delta-agent: Secondary | ICD-10-CM | POA: Diagnosis present

## 2021-10-23 DIAGNOSIS — O3663X0 Maternal care for excessive fetal growth, third trimester, not applicable or unspecified: Secondary | ICD-10-CM | POA: Diagnosis present

## 2021-10-23 DIAGNOSIS — Z0542 Observation and evaluation of newborn for suspected metabolic condition ruled out: Secondary | ICD-10-CM | POA: Diagnosis not present

## 2021-10-23 DIAGNOSIS — E669 Obesity, unspecified: Secondary | ICD-10-CM

## 2021-10-23 DIAGNOSIS — O9842 Viral hepatitis complicating childbirth: Secondary | ICD-10-CM | POA: Diagnosis not present

## 2021-10-23 DIAGNOSIS — O24415 Gestational diabetes mellitus in pregnancy, controlled by oral hypoglycemic drugs: Secondary | ICD-10-CM

## 2021-10-23 DIAGNOSIS — Z3A40 40 weeks gestation of pregnancy: Secondary | ICD-10-CM

## 2021-10-23 DIAGNOSIS — O99213 Obesity complicating pregnancy, third trimester: Secondary | ICD-10-CM | POA: Diagnosis not present

## 2021-10-23 DIAGNOSIS — Z98891 History of uterine scar from previous surgery: Secondary | ICD-10-CM

## 2021-10-23 DIAGNOSIS — O09529 Supervision of elderly multigravida, unspecified trimester: Secondary | ICD-10-CM

## 2021-10-23 DIAGNOSIS — O480 Post-term pregnancy: Secondary | ICD-10-CM | POA: Diagnosis not present

## 2021-10-23 DIAGNOSIS — Z8632 Personal history of gestational diabetes: Secondary | ICD-10-CM | POA: Diagnosis present

## 2021-10-23 DIAGNOSIS — Z91040 Latex allergy status: Secondary | ICD-10-CM

## 2021-10-23 DIAGNOSIS — O99210 Obesity complicating pregnancy, unspecified trimester: Secondary | ICD-10-CM | POA: Diagnosis present

## 2021-10-23 DIAGNOSIS — O10013 Pre-existing essential hypertension complicating pregnancy, third trimester: Secondary | ICD-10-CM

## 2021-10-23 DIAGNOSIS — O1092 Unspecified pre-existing hypertension complicating childbirth: Secondary | ICD-10-CM | POA: Diagnosis not present

## 2021-10-23 DIAGNOSIS — O99214 Obesity complicating childbirth: Secondary | ICD-10-CM | POA: Diagnosis not present

## 2021-10-23 DIAGNOSIS — O24429 Gestational diabetes mellitus in childbirth, unspecified control: Secondary | ICD-10-CM | POA: Diagnosis not present

## 2021-10-23 DIAGNOSIS — O1002 Pre-existing essential hypertension complicating childbirth: Secondary | ICD-10-CM | POA: Diagnosis not present

## 2021-10-23 DIAGNOSIS — O24424 Gestational diabetes mellitus in childbirth, insulin controlled: Secondary | ICD-10-CM | POA: Diagnosis not present

## 2021-10-23 DIAGNOSIS — O34211 Maternal care for low transverse scar from previous cesarean delivery: Secondary | ICD-10-CM | POA: Diagnosis not present

## 2021-10-23 HISTORY — DX: Gestational diabetes mellitus in pregnancy, unspecified control: O24.419

## 2021-10-23 LAB — PROTEIN / CREATININE RATIO, URINE
Creatinine, Urine: 33 mg/dL
Total Protein, Urine: <6 mg/dL

## 2021-10-23 LAB — COMPREHENSIVE METABOLIC PANEL
ALT: 12 U/L (ref 0–44)
AST: 16 U/L (ref 15–41)
Albumin: 2.5 g/dL — ABNORMAL LOW (ref 3.5–5.0)
Alkaline Phosphatase: 191 U/L — ABNORMAL HIGH (ref 38–126)
Anion gap: 8 (ref 5–15)
BUN: 5 mg/dL — ABNORMAL LOW (ref 6–20)
CO2: 22 mmol/L (ref 22–32)
Calcium: 9.1 mg/dL (ref 8.9–10.3)
Chloride: 107 mmol/L (ref 98–111)
Creatinine, Ser: 0.5 mg/dL (ref 0.44–1.00)
GFR, Estimated: >60 mL/min (ref >60)
Glucose, Bld: 85 mg/dL (ref 70–99)
Potassium: 3.8 mmol/L (ref 3.5–5.1)
Sodium: 137 mmol/L (ref 135–145)
Total Bilirubin: 0.6 mg/dL (ref 0.3–1.2)
Total Protein: 6.3 g/dL — ABNORMAL LOW (ref 6.5–8.1)

## 2021-10-23 LAB — CBC
HCT: 36.9 % (ref 36.0–46.0)
Hemoglobin: 12.1 g/dL (ref 12.0–15.0)
MCH: 27.2 pg (ref 26.0–34.0)
MCHC: 32.8 g/dL (ref 30.0–36.0)
MCV: 82.9 fL (ref 80.0–100.0)
Platelets: 236 10*3/uL (ref 150–400)
RBC: 4.45 MIL/uL (ref 3.87–5.11)
RDW: 14.8 % (ref 11.5–15.5)
WBC: 13.1 10*3/uL — ABNORMAL HIGH (ref 4.0–10.5)
nRBC: 0 % (ref 0.0–0.2)

## 2021-10-23 LAB — GLUCOSE, CAPILLARY
Glucose-Capillary: 76 mg/dL (ref 70–99)
Glucose-Capillary: 93 mg/dL (ref 70–99)

## 2021-10-23 LAB — TYPE AND SCREEN
ABO/RH(D): A POS
Antibody Screen: NEGATIVE

## 2021-10-23 SURGERY — Surgical Case
Anesthesia: Spinal

## 2021-10-23 MED ORDER — VITAMIN K1 1 MG/0.5ML IJ SOLN
INTRAMUSCULAR | Status: AC
  Filled 2021-10-23: qty 0.5

## 2021-10-23 MED ORDER — SCOPOLAMINE 1 MG/3DAYS TD PT72
1.0000 | MEDICATED_PATCH | Freq: Once | TRANSDERMAL | Status: DC
  Administered 2021-10-23: 1.5 mg via TRANSDERMAL

## 2021-10-23 MED ORDER — DEXAMETHASONE SODIUM PHOSPHATE 10 MG/ML IJ SOLN
INTRAMUSCULAR | Status: AC
  Filled 2021-10-23: qty 1

## 2021-10-23 MED ORDER — KETOROLAC TROMETHAMINE 30 MG/ML IJ SOLN
INTRAMUSCULAR | Status: DC | PRN
  Administered 2021-10-23: 30 mg via INTRAVENOUS

## 2021-10-23 MED ORDER — HYDROMORPHONE HCL 1 MG/ML IJ SOLN
INTRAMUSCULAR | Status: AC
  Filled 2021-10-23: qty 0.5

## 2021-10-23 MED ORDER — ACETAMINOPHEN 325 MG PO TABS
650.0000 mg | ORAL_TABLET | ORAL | Status: DC | PRN
  Administered 2021-10-23: 650 mg via ORAL
  Filled 2021-10-23: qty 2

## 2021-10-23 MED ORDER — ERYTHROMYCIN 5 MG/GM OP OINT
TOPICAL_OINTMENT | OPHTHALMIC | Status: AC
  Filled 2021-10-23: qty 1

## 2021-10-23 MED ORDER — DIPHENHYDRAMINE HCL 25 MG PO CAPS: 25.0000 mg | ORAL_CAPSULE | ORAL | Status: DC | PRN

## 2021-10-23 MED ORDER — TRANEXAMIC ACID-NACL 1000-0.7 MG/100ML-% IV SOLN
INTRAVENOUS | Status: DC | PRN
  Administered 2021-10-23: 1000 mg via INTRAVENOUS

## 2021-10-23 MED ORDER — ONDANSETRON HCL 4 MG/2ML IJ SOLN
INTRAMUSCULAR | Status: DC | PRN
  Administered 2021-10-23: 4 mg via INTRAVENOUS

## 2021-10-23 MED ORDER — FENTANYL CITRATE (PF) 100 MCG/2ML IJ SOLN: 100.0000 ug | INTRAMUSCULAR | Status: DC | PRN

## 2021-10-23 MED ORDER — FENTANYL CITRATE (PF) 100 MCG/2ML IJ SOLN
INTRAMUSCULAR | Status: DC | PRN
Start: 2021-10-23 — End: 2021-10-23
  Administered 2021-10-23: 15 ug via INTRATHECAL
  Administered 2021-10-23: 35 ug via INTRATHECAL

## 2021-10-23 MED ORDER — ONDANSETRON HCL 4 MG/2ML IJ SOLN: 4.0000 mg | Freq: Four times a day (QID) | INTRAMUSCULAR | Status: DC | PRN

## 2021-10-23 MED ORDER — FENTANYL CITRATE (PF) 100 MCG/2ML IJ SOLN
INTRAMUSCULAR | Status: AC
  Filled 2021-10-23: qty 2

## 2021-10-23 MED ORDER — LACTATED RINGERS IV SOLN: INTRAVENOUS | Status: DC

## 2021-10-23 MED ORDER — OXYTOCIN-SODIUM CHLORIDE 30-0.9 UT/500ML-% IV SOLN
INTRAVENOUS | Status: DC | PRN
  Administered 2021-10-23 (×2): 30 [IU] via INTRAVENOUS

## 2021-10-23 MED ORDER — PHENYLEPHRINE 80 MCG/ML (10ML) SYRINGE FOR IV PUSH (FOR BLOOD PRESSURE SUPPORT)
PREFILLED_SYRINGE | INTRAVENOUS | Status: AC
  Filled 2021-10-23: qty 10

## 2021-10-23 MED ORDER — LACTATED RINGERS IV SOLN: 500.0000 mL | INTRAVENOUS | Status: DC | PRN

## 2021-10-23 MED ORDER — HYDROMORPHONE HCL 1 MG/ML IJ SOLN
0.2500 mg | INTRAMUSCULAR | Status: DC | PRN
  Administered 2021-10-23: 0.5 mg via INTRAVENOUS

## 2021-10-23 MED ORDER — OXYCODONE-ACETAMINOPHEN 5-325 MG PO TABS: 1.0000 | ORAL_TABLET | ORAL | Status: DC | PRN

## 2021-10-23 MED ORDER — DEXTROSE 5 % IV SOLN
INTRAVENOUS | Status: DC | PRN
  Administered 2021-10-23: 3 g via INTRAVENOUS

## 2021-10-23 MED ORDER — PHENYLEPHRINE HCL-NACL 20-0.9 MG/250ML-% IV SOLN
INTRAVENOUS | Status: DC | PRN
  Administered 2021-10-23: 60 ug/min via INTRAVENOUS

## 2021-10-23 MED ORDER — MORPHINE SULFATE (PF) 0.5 MG/ML IJ SOLN
INTRAMUSCULAR | Status: AC
  Filled 2021-10-23: qty 10

## 2021-10-23 MED ORDER — PROMETHAZINE HCL 25 MG/ML IJ SOLN
12.5000 mg | Freq: Once | INTRAMUSCULAR | Status: DC
  Filled 2021-10-23: qty 1

## 2021-10-23 MED ORDER — FLEET ENEMA 7-19 GM/118ML RE ENEM: 1.0000 | ENEMA | RECTAL | Status: DC | PRN

## 2021-10-23 MED ORDER — DEXAMETHASONE SODIUM PHOSPHATE 10 MG/ML IJ SOLN
INTRAMUSCULAR | Status: DC | PRN
  Administered 2021-10-23: 10 mg via INTRAVENOUS

## 2021-10-23 MED ORDER — MEPERIDINE HCL 25 MG/ML IJ SOLN: 6.2500 mg | INTRAMUSCULAR | Status: DC | PRN

## 2021-10-23 MED ORDER — SOD CITRATE-CITRIC ACID 500-334 MG/5ML PO SOLN: 30.0000 mL | ORAL | Status: DC | PRN

## 2021-10-23 MED ORDER — ONDANSETRON HCL 4 MG/2ML IJ SOLN
INTRAMUSCULAR | Status: AC
  Filled 2021-10-23: qty 2

## 2021-10-23 MED ORDER — BUPIVACAINE IN DEXTROSE 0.75-8.25 % IT SOLN
INTRATHECAL | Status: DC | PRN
  Administered 2021-10-23: 1.6 mL via INTRATHECAL

## 2021-10-23 MED ORDER — PHENYLEPHRINE 80 MCG/ML (10ML) SYRINGE FOR IV PUSH (FOR BLOOD PRESSURE SUPPORT)
PREFILLED_SYRINGE | INTRAVENOUS | Status: DC | PRN
  Administered 2021-10-23: 240 ug via INTRAVENOUS

## 2021-10-23 MED ORDER — LIDOCAINE HCL (PF) 1 % IJ SOLN: 30.0000 mL | INTRAMUSCULAR | Status: DC | PRN

## 2021-10-23 MED ORDER — NALOXONE HCL 0.4 MG/ML IJ SOLN: 0.4000 mg | INTRAMUSCULAR | Status: DC | PRN

## 2021-10-23 MED ORDER — OXYCODONE-ACETAMINOPHEN 5-325 MG PO TABS: 2.0000 | ORAL_TABLET | ORAL | Status: DC | PRN

## 2021-10-23 MED ORDER — NALOXONE HCL 4 MG/10ML IJ SOLN: 1.0000 ug/kg/h | INTRAVENOUS | Status: DC | PRN

## 2021-10-23 MED ORDER — METHYLERGONOVINE MALEATE 0.2 MG/ML IJ SOLN
INTRAMUSCULAR | Status: DC | PRN
  Administered 2021-10-23: .2 mg via INTRAMUSCULAR

## 2021-10-23 MED ORDER — MORPHINE SULFATE (PF) 0.5 MG/ML IJ SOLN
INTRAMUSCULAR | Status: DC | PRN
  Administered 2021-10-23: 150 ug via INTRATHECAL

## 2021-10-23 MED ORDER — MORPHINE SULFATE (PF) 10 MG/ML IV SOLN
10.0000 mg | Freq: Once | INTRAVENOUS | Status: DC
  Filled 2021-10-23: qty 1

## 2021-10-23 MED ORDER — DIPHENHYDRAMINE HCL 50 MG/ML IJ SOLN: 12.5000 mg | INTRAMUSCULAR | Status: DC | PRN

## 2021-10-23 MED ORDER — SODIUM CHLORIDE 0.9% FLUSH: 3.0000 mL | INTRAVENOUS | Status: DC | PRN

## 2021-10-23 MED ORDER — PROMETHAZINE HCL 25 MG/ML IJ SOLN: 6.2500 mg | INTRAMUSCULAR | Status: DC | PRN

## 2021-10-23 MED ORDER — SCOPOLAMINE 1 MG/3DAYS TD PT72
MEDICATED_PATCH | TRANSDERMAL | Status: AC
  Filled 2021-10-23: qty 1

## 2021-10-23 SURGICAL SUPPLY — 32 items
CHLORAPREP W/TINT 26ML (MISCELLANEOUS) ×4 IMPLANT
CLAMP CORD UMBIL (MISCELLANEOUS) ×2 IMPLANT
DRSG OPSITE POSTOP 4X10 (GAUZE/BANDAGES/DRESSINGS) ×2 IMPLANT
DRSG OPSITE POSTOP 4X12 (GAUZE/BANDAGES/DRESSINGS) ×1 IMPLANT
ELECT REM PT RETURN 9FT ADLT (ELECTROSURGICAL) ×2
ELECTRODE REM PT RTRN 9FT ADLT (ELECTROSURGICAL) ×1 IMPLANT
EXTRACTOR VACUUM M CUP 4 TUBE (SUCTIONS) IMPLANT
GLOVE BIOGEL PI IND STRL 6.5 (GLOVE) ×1 IMPLANT
GLOVE BIOGEL PI IND STRL 7.0 (GLOVE) ×1 IMPLANT
GLOVE BIOGEL PI INDICATOR 6.5 (GLOVE) ×1
GLOVE BIOGEL PI INDICATOR 7.0 (GLOVE) ×1
GLOVE SURG SS PI 6.5 STRL IVOR (GLOVE) ×2 IMPLANT
GOWN STRL REUS W/TWL LRG LVL3 (GOWN DISPOSABLE) ×4 IMPLANT
HEMOSTAT SURGICEL 4X8 (HEMOSTASIS) ×1 IMPLANT
KIT ABG SYR 3ML LUER SLIP (SYRINGE) IMPLANT
NDL HYPO 25X5/8 SAFETYGLIDE (NEEDLE) IMPLANT
NEEDLE HYPO 25X5/8 SAFETYGLIDE (NEEDLE) IMPLANT
NS IRRIG 1000ML POUR BTL (IV SOLUTION) ×2 IMPLANT
PACK C SECTION WH (CUSTOM PROCEDURE TRAY) ×2 IMPLANT
PAD ABD 7.5X8 STRL (GAUZE/BANDAGES/DRESSINGS) ×1 IMPLANT
PAD OB MATERNITY 4.3X12.25 (PERSONAL CARE ITEMS) ×2 IMPLANT
RTRCTR C-SECT PINK 25CM LRG (MISCELLANEOUS) IMPLANT
SPONGE GAUZE 4X4 12PLY STER LF (GAUZE/BANDAGES/DRESSINGS) ×2 IMPLANT
SUT PDS AB 0 CTX 60 (SUTURE) ×1 IMPLANT
SUT PLAIN 0 NONE (SUTURE) IMPLANT
SUT VIC AB 0 CT1 36 (SUTURE) ×8 IMPLANT
SUT VIC AB 0 CTX 36 (SUTURE) ×2
SUT VIC AB 0 CTX36XBRD ANBCTRL (SUTURE) IMPLANT
SUT VIC AB 4-0 KS 27 (SUTURE) ×2 IMPLANT
TOWEL OR 17X24 6PK STRL BLUE (TOWEL DISPOSABLE) ×2 IMPLANT
TRAY FOLEY W/BAG SLVR 14FR LF (SET/KITS/TRAYS/PACK) ×2 IMPLANT
WATER STERILE IRR 1000ML POUR (IV SOLUTION) ×2 IMPLANT

## 2021-10-23 NOTE — Discharge Summary (Signed)
Postpartum Discharge Summary     Patient Name: Janet Jacobs DOB: [birth year] MRN: [id number]  Date of admission: 10/23/2021 Delivery date:10/23/2021  Delivering provider: Verita Schneiders A  Date of discharge: 10/26/2021  Admitting diagnosis: ELEV BP Intrauterine pregnancy: [redacted]w[redacted]d     Secondary diagnosis:  Active Problems:   * No active hospital problems. *  Additional problems: CHTN on procardia (previously on amlodipine) and GDMA2 on metformin    Discharge diagnosis: Term Pregnancy Delivered and CHTN                                              Post partum procedures: N/A Augmentation: none Complications: None  Hospital course: Induction of Labor With Cesarean Section   41 y.o. yo [id number] at [redacted]w[redacted]d was admitted to the hospital 10/23/2021 for induction of labor. Patient later elected not to attempt vaginal delivery and opted for repeat cesarean section. The patient went for cesarean section due to Elective Repeat. Delivery details are as follows: Membrane Rupture Time/Date:  ,   Delivery Method:C-Section, Vacuum Assisted  Details of operation can be found in separate operative Note.  Patient had an uncomplicated postpartum course. She opted to continue HTN management with procardia with  good control. She is ambulating, tolerating a regular diet, passing flatus, and urinating well.  Patient is discharged home in stable condition on 10/26/21.      Newborn Data: Birth date:10/23/2021  Birth time:9:10 PM  Gender:Female  Living status:Living  Apgars:8 ,9  Weight:4240 g                                Magnesium Sulfate received: No BMZ received: No Rhophylac:N/A MMR:N/A T-DaP:Given prenatally Flu: Yes (given during flu season) Transfusion:No  Physical exam  Vitals:   10/25/21 0524 10/25/21 1133 10/25/21 1941 10/26/21 0405  BP: 106/72 131/82 133/73 128/85  Pulse: 82 75 85 80  Resp: $Remo'17 20 18 16  'CqRuo$ Temp: 97.9 F (36.6 C) 98.1 F (36.7 C) 98.4 F (36.9 C) 98.3 F (36.8  C)  TempSrc: Oral  Oral Oral  SpO2: 100%     Weight:      Height:       General: alert, cooperative, and no distress Lochia: appropriate Uterine Fundus: firm Incision: Small old dried drainage on honeycomb dressing DVT Evaluation: No evidence of DVT seen on physical exam. Labs: Lab Results  Component Value Date   WBC 13.1 (H) 10/23/2021   HGB 12.1 10/23/2021   HCT 36.9 10/23/2021   MCV 82.9 10/23/2021   PLT 236 10/23/2021      Latest Ref Rng & Units 10/23/2021    4:15 PM  CMP  Glucose 70 - 99 mg/dL 85   BUN 6 - 20 mg/dL 5   Creatinine 0.44 - 1.00 mg/dL 0.50   Sodium 135 - 145 mmol/L 137   Potassium 3.5 - 5.1 mmol/L 3.8   Chloride 98 - 111 mmol/L 107   CO2 22 - 32 mmol/L 22   Calcium 8.9 - 10.3 mg/dL 9.1   Total Protein 6.5 - 8.1 g/dL 6.3   Total Bilirubin 0.3 - 1.2 mg/dL 0.6   Alkaline Phos 38 - 126 U/L 191   AST 15 - 41 U/L 16   ALT 0 - 44 U/L 12    Edinburgh Score:  No data to display           After visit meds:  Allergies as of 10/26/2021       Reactions   Latex Hives, Itching, Rash        Medication List     STOP taking these medications    Accu-Chek Guide test strip Generic drug: glucose blood   Accu-Chek Softclix Lancets lancets   aspirin EC 81 MG tablet   metFORMIN 500 MG tablet Commonly known as: GLUCOPHAGE       TAKE these medications    Accu-Chek Guide w/Device Kit 1 Device by Does not apply route as needed.   ibuprofen 600 MG tablet Commonly known as: ADVIL Take 1 tablet (600 mg total) by mouth every 6 (six) hours.   lansoprazole 30 MG capsule Commonly known as: PREVACID Take 30 mg by mouth at bedtime.   NIFEdipine 30 MG 24 hr tablet Commonly known as: PROCARDIA-XL/NIFEDICAL-XL Take 30 mg by mouth daily.   oxyCODONE-acetaminophen 5-325 MG tablet Commonly known as: PERCOCET/ROXICET Take 1-2 tablets by mouth every 4 (four) hours as needed for up to 3 days for moderate pain.   prenatal multivitamin Tabs  tablet Take 1 tablet by mouth at bedtime.   simethicone 80 MG chewable tablet Commonly known as: MYLICON Chew 1 tablet (80 mg total) by mouth as needed for flatulence.   ZYRTEC ALLERGY PO         Discharge home in stable condition Infant Feeding: Breast Infant Disposition:home with mother Discharge instruction: per After Visit Summary and Postpartum booklet. Activity: Advance as tolerated. Pelvic rest for 6 weeks.  Diet: routine diet Future Appointments:No future appointments. Follow up Visit:   Please schedule this patient for a In person postpartum visit in 4 weeks with the following provider: Any provider. Additional Postpartum F/U:Incision check 1 week and BP check 1 week  High risk pregnancy complicated by: GDM and HTN Delivery mode:  C-Section, Vacuum Assisted  Anticipated Birth Control:   vasectomy   Mallie Snooks, [place], MSN, CNM Certified Nurse Midwife, Product/process development scientist for [employer], [place]

## 2021-10-23 NOTE — Progress Notes (Signed)
Patient opted to have a repeat cesarean section. Risks, benefits and alternatives were explained including risks of bleeding, infection and damage to adjacent organs. Patient verbalized understanding and all questions were answered. Patient understands the elevated risks associated with TOLAC following 2 cesarean sections and understands that a cesarean section will be offered with her next pregnancy, if that is to occur. Patient and her husband plan for vasectomy for contraception  Consent signed. All questions were answered. Patient last ate at noon. Plan for cesarean section at 8pm

## 2021-10-23 NOTE — Anesthesia Procedure Notes (Signed)
Spinal  Patient location during procedure: OR Start time: 10/23/2021 8:20 PM End time: 10/23/2021 8:28 PM Reason for block: surgical anesthesia Staffing Performed: anesthesiologist  Anesthesiologist: Nolon Nations, MD Performed by: Nolon Nations, MD Authorized by: Nolon Nations, MD   Preanesthetic Checklist Completed: patient identified, IV checked, site marked, risks and benefits discussed, surgical consent, monitors and equipment checked, pre-op evaluation and timeout performed Spinal Block Patient position: sitting Prep: DuraPrep and site prepped and draped Patient monitoring: heart rate, continuous pulse ox and blood pressure Approach: midline Location: L3-4 Injection technique: single-shot Needle Needle type: Spinocan  Needle gauge: 25 G Needle length: 9 cm Additional Notes Expiration date of kit checked and confirmed. Patient tolerated procedure well, without complications.

## 2021-10-23 NOTE — MAU Note (Signed)
Janet Jacobs is a 41 y.o. at 41w6dhere in MAU reporting: sent over  "get checked, have a baby".  BP up, [facility].  Denis HA, visual changes, epigastric pain or increased swelling.  Denies bleeding or leaking, reports +FM.  Having some ctxs- no pain though.   Pain score: none Vitals:   10/23/21 1415  BP: (!) 154/104  Pulse: (!) 101  Resp: 18  Temp: 98.6 F (37 C)  SpO2: 98%     FHT:160 Lab orders placed from triage:

## 2021-10-23 NOTE — H&P (Signed)
OBSTETRIC ADMISSION HISTORY AND PHYSICAL  Janet Jacobs Emiley Digiacomo is a 41 y.o. female [phone] with IUP at 64w6dby LMP presenting to the MAU per Dr. BShon Batonrecommendation for IOL due to worsening cHTN and GDM.   [place] but with her elevated blood pressures has decided to proceed with hospital IOL. She reports having a stressful and traumatic experience last delivery (urinary catheter placed post-op without anesthesia, no pain meds quick enough after surgery etc). She is a doula in the spring/summer usually. Plans for proceeding with IOL but if takes "too long" would like to go for repeat CS.   She reports +FMs, No LOF, no VB, no blurry vision, headaches or peripheral edema, and RUQ pain.  She plans on breast feeding. She plans for vasectomy for birth control.  [facility]  Dating: By LMP --->  Estimated Date of Delivery: 10/17/21  Sono:   MFM [place] showing EFW around 10 lbs but report not completed.   _0 , CWD, normal anatomy, cephalic presentation,  [id number] 97% EFW   Prenatal History/Complications:  --Chronic hypertension on procardia, recently increased her BP on her own to procardia 198mTID (from 30 daily)  --A2GDM: Metformin at bedtime.  --Possible history of Hep B-- no evidence of Hep B infection on labs with ID in 2017  -- Previous CS for breech presentation, 2 vaginal deliveries prior to this   Past Medical History: Past Medical History:  Diagnosis Date   Complication of anesthesia    HAS RESISTANCE TO LIDOCAINE SETTING UP AT DENTIST AND PAIN CONTROL.  [facility]   Delivered by cesarean section 08/27/2015   Gestational diabetes    Hypertension    No pertinent past medical history     Past Surgical History: Past Surgical History:  Procedure Laterality Date   CESAREAN SECTION N/A 08/27/2015   Procedure: CESAREAN SECTION;  Surgeon: EmWaymon AmatoMD;  [facility]  [facility] Service: Obstetrics;  Laterality: N/A;   WISDOM TOOTH EXTRACTION      Obstetrical History: OB History     Gravida  6   Para  3   Term  3   Preterm  0   AB  2   Living  3      SAB  0   IAB  2   Ectopic  0   Multiple  0   Live Births  3           Social History Social History   Socioeconomic History   Marital status: Married    Spouse name: Not on file   Number of children: 3   Years of education: Not on file   Highest education level: Master's degree (e.g., MA, MS, MEng, MEd, MSW, MBA)  Occupational History   Not on file  Tobacco Use   Smoking status: Never    Passive exposure: Never   Smokeless tobacco: Never  Vaping Use   Vaping Use: Never used  Substance and Sexual Activity   Alcohol use: No   Drug use: No   Sexual activity: Yes  Other Topics Concern   Not on file  Social History Narrative   Not on file   Social Determinants of Health   Financial Resource Strain: Not on file  Food Insecurity: No Food Insecurity (08/13/2021)   Hunger Vital Sign    Worried About Running Out of Food in the Last Year:  Never true    Ran Out of Food in the Last Year: Never true  Transportation Needs: No Transportation Needs (08/13/2021)   PRAPARE - Hydrologist (Medical): No    Lack of Transportation (Non-Medical): No  Physical Activity: Not on file  Stress: Not on file  Social Connections: Not on file    Family History: Family History  Problem Relation Age of Onset   Hypertension Mother    Miscarriages / [place] Mother    Arthritis Father    Diabetes Father    Hypertension Father    Alcohol abuse Neg Hx    Asthma Neg Hx    Birth defects Neg Hx    Cancer Neg Hx    COPD Neg Hx    Depression Neg Hx    Drug abuse Neg Hx    Early death Neg Hx    Hearing loss Neg Hx    Heart disease Neg Hx    Hyperlipidemia Neg Hx    Kidney disease Neg Hx    Learning disabilities Neg Hx    Mental illness Neg Hx     Mental retardation Neg Hx    Stroke Neg Hx    Vision loss Neg Hx     Allergies: Allergies  Allergen Reactions   Latex Hives, Itching and Rash    Medications Prior to Admission  Medication Sig Dispense Refill Last Dose   aspirin EC 81 MG tablet Take 1 tablet (81 mg total) by mouth daily. Take after 12 weeks for prevention of preeclampsia later in pregnancy 300 tablet 2 10/23/2021   Cetirizine HCl (ZYRTEC ALLERGY PO)    10/23/2021   lansoprazole (PREVACID) 30 MG capsule Take 30 mg by mouth at bedtime.   10/23/2021   metFORMIN (GLUCOPHAGE) 500 MG tablet Take 1 tablet (500 mg total) by mouth at bedtime. 60 tablet 0 10/22/2021   NIFEdipine (PROCARDIA-XL/NIFEDICAL-XL) 30 MG 24 hr tablet Take 30 mg by mouth daily.   10/23/2021   Prenatal Vit-Fe Fumarate-FA (PRENATAL MULTIVITAMIN) TABS Take 1 tablet by mouth at bedtime.    10/23/2021   Accu-Chek Softclix Lancets lancets Use as instructed 100 each 12    Blood Glucose Monitoring Suppl (ACCU-CHEK GUIDE) w/Device KIT 1 Device by Does not apply route as needed. 1 kit 0    glucose blood (ACCU-CHEK GUIDE) test strip Use as instructed 100 each 12      Review of Systems   All systems reviewed and negative except as stated in HPI  Blood pressure (!) 157/105, pulse (!) 107, temperature 98.6 F (37 C), temperature source Oral, resp. rate 18, height _0  (1.626 m), weight (!) 139.2 kg, last menstrual period 01/10/2021, SpO2 99 %. General appearance: alert, cooperative, and no distress Lungs: Normal WOB Heart: regular rate  Abdomen: soft, non-tender Extremities: Homans sign is negative, no sign of DVT Presentation: cephalic by BSUS  Fetal monitoringBaseline: 150 bpm, Variability: Good {> 6 bpm), Accelerations: Reactive, and Decelerations: Absent Uterine activity irregular   Dilation: (P) Fingertip Cervical Position: (P) Posterior Exam by:: (P) Dr. Higinio Plan    Prenatal labs: ABO, Rh: A/Positive/-- (12/21 1005) Antibody: Negative (12/21  1005) Rubella: 1.00 (12/21 1005) RPR: Non Reactive (04/24 0903)  HBsAg: Negative (12/21 1005)  HIV: Non Reactive (04/24 0903)  GBS: Negative/-- (06/23 1031)  2 hr Glucola abnormal  Genetic screening  neg horizon, NIPS abnormal due to low FF Anatomy US normal   Prenatal Transfer Tool  Maternal Diabetes: Yes:  Diabetes Type:  Insulin/Medication controlled Genetic Screening: Normal Maternal Ultrasounds/Referrals: Normal Fetal Ultrasounds or other Referrals:  None Maternal Substance Abuse:  No Significant Maternal Medications:  procardia, metformin  Significant Maternal Lab Results: Group B Strep negative  No results found for this or any previous visit (from the past 24 hour(s)).  Patient Active Problem List   Diagnosis Date Noted   Gestational diabetes 08/26/2021   Supervision of high risk pregnancy, antepartum 03/10/2021   Chronic hepatitis B without delta agent without cirrhosis ([place]) 09/04/2015   Chronic hypertension affecting pregnancy 09/03/2015   Latex allergy 08/23/2015   BMI 45.0-49.9, adult ([place]) 08/23/2015   AMA (advanced maternal age) multigravida 35+ 08/23/2015   Cystic hygroma 01/04/2013    Assessment/Plan:  Miranda Frese is a 40 y.o. [id number] at 85w6dhere for IOL due to chronic hypertension with superimposed pre-eclampsia and A2GDM.   #Labor/TOLAC: Checked cervix in MAU per patient request-- feels like fingertip but did not push completely to internal os as they will check her soon again on L&D for likely FB placement. Plan for FB when able +/- Pit.  #Pain: PRN,planning without epidural  #FWB: Cat I  #ID: GBS Neg #MOF: Breast #MOC: Vasectomy  #Circ: NA   #Chronic Hypertension w/ superimposed pre-eclampsia: BP mild range-- but close to severe, running in the 150's/105's on an increased dose of home procardia (been doing 418mfrom 307m Asymptomatic. Pre-e labs pending. Cont procardia. Monitor closely.   #A2GDM: Plan CBG q4. Concurrent suspected  macrosomia (final US [place] from today pending, but LGA ~ 97%). L&D team to discuss additional risks ie SD.   SamPatriciaann ClanO  10/23/2021, 3:14 PM

## 2021-10-23 NOTE — Progress Notes (Signed)
Arrived to room. Staff at bedside decline VAST services. Unit staff attempting placement. Fran Lowes, RN VAST

## 2021-10-23 NOTE — Transfer of Care (Signed)
Immediate Anesthesia Transfer of Care Note  Patient: Janet Jacobs  Procedure(s) Performed: CESAREAN SECTION  Patient Location: PACU  Anesthesia Type:Spinal  Level of Consciousness: awake, alert  and patient cooperative  Airway & Oxygen Therapy: Patient Spontanous Breathing  Post-op Assessment: Report given to RN and Post -op Vital signs reviewed and stable  Post vital signs: Reviewed and stable  Last Vitals:  Vitals Value Taken Time  BP 131/82   Temp    Pulse 86   Resp 14   SpO2 98     Last Pain:  Vitals:   10/23/21 2007  TempSrc:   PainSc: 0-No pain         Complications: No notable events documented.

## 2021-10-23 NOTE — Op Note (Signed)
Janet Jacobs Janet Jacobs PROCEDURE DATE: 10/23/2021  PREOPERATIVE DIAGNOSES: Intrauterine pregnancy at 54w6dweeks gestation;  chronic hypertension; gestational diabetes; morbid obesity  POSTOPERATIVE DIAGNOSES: The same  PROCEDURE: Low Transverse Cesarean Section  SURGEON:  Dr. UVerita Jacobs ASSISTANT:  Janet Jacobs MS3  ANESTHESIOLOGY TEAM: Anesthesiologist: Janet Nations MD CRNA: Janet Comp CRNA  INDICATIONS: Janet Haggardis a 41y.o. Janet [phone] for cesarean section secondary to the indications listed under preoperative diagnoses; please see preoperative note for further details.  The risks of surgery were discussed with the patient including but were not limited to: bleeding which may require transfusion or reoperation; infection which may require antibiotics; injury to bowel, bladder, ureters or other surrounding organs; injury to the fetus; need for additional procedures including hysterectomy in the event of a life-threatening hemorrhage; formation of adhesions; placental abnormalities wth subsequent pregnancies; incisional problems; thromboembolic phenomenon and other postoperative/anesthesia complications.  The patient concurred with the proposed plan, giving informed written consent for the procedure.    FINDINGS:  Viable female infant in cephalic presentation.  Apgars 8 and 9.  Clear amniotic fluid.  Intact placenta, three vessel cord.  Normal uterus, fallopian tubes and ovaries bilaterally. Moderate pre-peritoneal adhesive disease, very tough fascia that was very adherent to the rectus muscles.  Minimal intraperitoneal adhesions involving bladder and lower uterine segment.  ANESTHESIA: Spinal ESTIMATED BLOOD LOSS: 633 ml SPECIMENS: Placenta sent to L&D COMPLICATIONS: None immediate  PROCEDURE IN DETAIL:  The patient preoperatively received intravenous antibiotics and had sequential compression devices applied to her lower extremities.  She was then  taken to the operating room where spinal anesthesia was administered and was found to be adequate. She was then placed in a dorsal supine position with a leftward tilt, and prepped and draped in a sterile manner.  A foley catheter was placed into her bladder and attached to constant gravity.  After an adequate timeout was performed, a Pfannenstiel skin incision was made with scalpel on her preexisting scar and carried through to the underlying layer of fascia.  The fascia was incised in the midline, and this incision was extended bilaterally using the Mayo scissors.  Kocher clamps were applied to the superior aspect of the fascial incision and the underlying rectus muscles were dissected off bluntly and sharply.  A similar process was carried out on the inferior aspect of the fascial incision. The rectus muscles were separated in the midline and the peritoneum was entered sharply. The Alexis self-retaining retractor was introduced into the abdominal cavity.  Attention was turned to the lower uterine segment where a low transverse hysterotomy was made with a scalpel and extended bilaterally bluntly.  The infant was successfully delivered, the cord was clamped and cut after one minute, and the infant was handed over to the awaiting neonatology team. Uterine massage was then administered, and the placenta delivered intact with a three-vessel cord. The uterus was then cleared of clots and debris.  The hysterotomy was closed with 0 Vicryl in a running locked fashion, and an imbricating layer was also placed with 0 Vicryl.  Figure-of-eight 0 Vicryl serosal stitches were placed to help with hemostasis.  The pelvis was cleared of all clot and debris. Hemostasis was confirmed on all surfaces.  The retractor was removed.  The fascia was then closed using looped 0 PDS in a running fashion.  The subcutaneous layer was irrigated, reapproximated with 0 Vicryl interrupted stitches, and the skin was closed with a 4-0 Vicryl  subcuticular stitch. The patient tolerated the procedure well. Sponge, instrument and needle counts were correct x 3.  She was taken to the recovery room in stable condition.     Janet Schneiders, MD, Janet Jacobs for [employer], [place]

## 2021-10-23 NOTE — Anesthesia Preprocedure Evaluation (Signed)
Anesthesia Evaluation  Patient identified by MRN, date of birth, ID band Patient awake    Reviewed: Allergy & Precautions, H&P , NPO status , Patient's Chart, lab work & pertinent test results  Airway Mallampati: II  TM Distance: >3 FB Neck ROM: full    Dental no notable dental hx.    Pulmonary neg pulmonary ROS,    Pulmonary exam normal        Cardiovascular hypertension, Pt. on medications Normal cardiovascular exam     Neuro/Psych negative neurological ROS  negative psych ROS   GI/Hepatic negative GI ROS, (+) Hepatitis -  Endo/Other  diabetesMorbid obesity  Renal/GU negative Renal ROS     Musculoskeletal   Abdominal (+) + obese,   Peds  Hematology negative hematology ROS (+)   Anesthesia Other Findings   Reproductive/Obstetrics (+) Pregnancy                             Anesthesia Physical  Anesthesia Plan  ASA: 3  Anesthesia Plan: Spinal   Post-op Pain Management: Ofirmev IV (intra-op)* and Toradol IV (intra-op)*   Induction: Intravenous  PONV Risk Score and Plan: 4 or greater and Ondansetron, Dexamethasone, Treatment may vary due to age or medical condition and Scopolamine patch - Pre-op  Airway Management Planned: Natural Airway  Additional Equipment:   Intra-op Plan:   Post-operative Plan:   Informed Consent: I have reviewed the patients History and Physical, chart, labs and discussed the procedure including the risks, benefits and alternatives for the proposed anesthesia with the patient or authorized representative who has indicated his/her understanding and acceptance.       Plan Discussed with: CRNA  Anesthesia Plan Comments:         Anesthesia Quick Evaluation

## 2021-10-23 NOTE — Progress Notes (Signed)
MFM Brief Note  [name] is a 41 yo G6P3 at 3w 6d who is here for follow up growth and BPP. She is seen at the request of Elspeth Cho, CNM  Today she is doing well and fills fine.  She is waiting labor but has discussed with providers of going into labor naturally.  Her blood pressure is 168/105, 167/97 and 167/115 mmHg.   She denies s/sx of preeclampsia.  Normal interval growth with measurements consistent with large for gestational age. Good fetal movement and amniotic fluid volume  Biophysical profile 8/8  Given today's findings and late gestational age in the context chronic hypertension I recommend that [name] go to the hospital to be evaluation for preeclampsia with severe features.  All questions answered. I discussed this case and management with Dr. Ernestina Patches.  I spent 20 minutes with > 50% in face to face consulation.  Vikki Ports, MD

## 2021-10-24 ENCOUNTER — Encounter (HOSPITAL_COMMUNITY): Payer: Self-pay | Admitting: Obstetrics and Gynecology

## 2021-10-24 DIAGNOSIS — Z98891 History of uterine scar from previous surgery: Secondary | ICD-10-CM

## 2021-10-24 LAB — CBC
HCT: 33.4 % — ABNORMAL LOW (ref 36.0–46.0)
Hemoglobin: 11.1 g/dL — ABNORMAL LOW (ref 12.0–15.0)
MCH: 27.3 pg (ref 26.0–34.0)
MCHC: 33.2 g/dL (ref 30.0–36.0)
MCV: 82.3 fL (ref 80.0–100.0)
Platelets: 208 10*3/uL (ref 150–400)
RBC: 4.06 MIL/uL (ref 3.87–5.11)
RDW: 15 % (ref 11.5–15.5)
WBC: 17.1 10*3/uL — ABNORMAL HIGH (ref 4.0–10.5)
nRBC: 0 % (ref 0.0–0.2)

## 2021-10-24 LAB — CREATININE, SERUM
Creatinine, Ser: 0.54 mg/dL (ref 0.44–1.00)
GFR, Estimated: >60 mL/min (ref >60)

## 2021-10-24 LAB — GLUCOSE, CAPILLARY: Glucose-Capillary: 149 mg/dL — ABNORMAL HIGH (ref 70–99)

## 2021-10-24 LAB — RPR: RPR Ser Ql: NONREACTIVE

## 2021-10-24 MED ORDER — ENOXAPARIN SODIUM 80 MG/0.8ML IJ SOSY
70.0000 mg | PREFILLED_SYRINGE | INTRAMUSCULAR | Status: DC
  Administered 2021-10-24 – 2021-10-25 (×2): 70 mg via SUBCUTANEOUS
  Filled 2021-10-24 (×2): qty 0.8

## 2021-10-24 MED ORDER — SENNOSIDES-DOCUSATE SODIUM 8.6-50 MG PO TABS
2.0000 | ORAL_TABLET | Freq: Every day | ORAL | Status: DC
  Administered 2021-10-24 – 2021-10-26 (×3): 2 via ORAL
  Filled 2021-10-24 (×3): qty 2

## 2021-10-24 MED ORDER — DIBUCAINE (PERIANAL) 1 % EX OINT
1.0000 | TOPICAL_OINTMENT | CUTANEOUS | Status: DC | PRN
Start: 2021-10-24 — End: 2021-10-26

## 2021-10-24 MED ORDER — SIMETHICONE 80 MG PO CHEW: 80.0000 mg | CHEWABLE_TABLET | ORAL | Status: DC | PRN

## 2021-10-24 MED ORDER — NIFEDIPINE ER OSMOTIC RELEASE 30 MG PO TB24
30.0000 mg | ORAL_TABLET | Freq: Every day | ORAL | Status: DC
  Administered 2021-10-24 – 2021-10-26 (×4): 30 mg via ORAL
  Filled 2021-10-24 (×4): qty 1

## 2021-10-24 MED ORDER — COCONUT OIL OIL
1.0000 | TOPICAL_OIL | Status: DC | PRN
Start: 2021-10-24 — End: 2021-10-26

## 2021-10-24 MED ORDER — OXYCODONE-ACETAMINOPHEN 5-325 MG PO TABS
1.0000 | ORAL_TABLET | ORAL | Status: DC | PRN
  Administered 2021-10-24 (×2): 1 via ORAL
  Administered 2021-10-24 – 2021-10-26 (×11): 2 via ORAL
  Filled 2021-10-24 (×2): qty 2
  Filled 2021-10-24: qty 1
  Filled 2021-10-24 (×4): qty 2
  Filled 2021-10-24: qty 1
  Filled 2021-10-24 (×5): qty 2

## 2021-10-24 MED ORDER — OXYTOCIN-SODIUM CHLORIDE 30-0.9 UT/500ML-% IV SOLN
2.5000 [IU]/h | INTRAVENOUS | Status: AC
  Administered 2021-10-24: 2.5 [IU]/h via INTRAVENOUS
  Filled 2021-10-24: qty 500

## 2021-10-24 MED ORDER — WITCH HAZEL-GLYCERIN EX PADS: 1.0000 | MEDICATED_PAD | CUTANEOUS | Status: DC | PRN

## 2021-10-24 MED ORDER — DIPHENHYDRAMINE HCL 25 MG PO CAPS: 25.0000 mg | ORAL_CAPSULE | Freq: Four times a day (QID) | ORAL | Status: DC | PRN

## 2021-10-24 MED ORDER — PRENATAL MULTIVITAMIN CH
1.0000 | ORAL_TABLET | Freq: Every day | ORAL | Status: DC
  Administered 2021-10-24 – 2021-10-25 (×2): 1 via ORAL
  Filled 2021-10-24 (×2): qty 1

## 2021-10-24 MED ORDER — SIMETHICONE 80 MG PO CHEW
80.0000 mg | CHEWABLE_TABLET | Freq: Three times a day (TID) | ORAL | Status: DC
Start: 2021-10-24 — End: 2021-10-26
  Administered 2021-10-24 – 2021-10-26 (×7): 80 mg via ORAL
  Filled 2021-10-24 (×7): qty 1

## 2021-10-24 MED ORDER — IBUPROFEN 600 MG PO TABS
600.0000 mg | ORAL_TABLET | Freq: Four times a day (QID) | ORAL | Status: DC
  Administered 2021-10-24 – 2021-10-26 (×9): 600 mg via ORAL
  Filled 2021-10-24 (×10): qty 1

## 2021-10-24 MED ORDER — MENTHOL 3 MG MT LOZG: 1.0000 | LOZENGE | OROMUCOSAL | Status: DC | PRN

## 2021-10-24 MED ORDER — TETANUS-DIPHTH-ACELL PERTUSSIS 5-2.5-18.5 LF-MCG/0.5 IM SUSY: 0.5000 mL | PREFILLED_SYRINGE | Freq: Once | INTRAMUSCULAR | Status: DC

## 2021-10-24 MED ORDER — ENOXAPARIN SODIUM 40 MG/0.4ML IJ SOSY: 40.0000 mg | PREFILLED_SYRINGE | INTRAMUSCULAR | Status: DC

## 2021-10-24 NOTE — Progress Notes (Signed)
Subjective: Postpartum Day 1: Cesarean Delivery Patient reports incisional pain and tolerating PO.    Objective: Vital signs in last 24 hours: Temp:  [97.5 F (36.4 C)-98.6 F (37 C)] 97.8 F (36.6 C) (07/21 0650) Pulse Rate:  [81-115] 82 (07/21 0650) Resp:  [15-25] 19 (07/21 0650) BP: (61-168)/(47-115) 130/85 (07/21 0650) SpO2:  [96 %-100 %] 97 % (07/21 0650) Weight:  [139.2 kg] 139.2 kg (07/20 1415)  Physical Exam:  General: alert, cooperative, and no distress Lochia: appropriate Uterine Fundus: firm Incision: no significant drainage DVT Evaluation: No evidence of DVT seen on physical exam.  Recent Labs    10/23/21 1615 10/24/21 0241  HGB 12.1 11.1*  HCT 36.9 33.4*  CBG (last 3)  Recent Labs    10/23/21 1742 10/23/21 2235 10/24/21 0613  GLUCAP 76 93 149*     Assessment/Plan: Status post Cesarean section. Doing well postoperatively.  Continue current care.  Hansel Feinstein, CNM 10/24/2021, 8:40 AM

## 2021-10-24 NOTE — Lactation Note (Signed)
This note was copied from a baby's chart. Lactation Consultation Note  Patient Name: Janet Jacobs [id number] Date: 10/24/2021   Age:42 hours Per RN [name]) mom declined [place] would like be seen in the morning.  Maternal Data    Feeding    LATCH Score   Lactation Tools Discussed/Used    Interventions    Discharge    Consult Status      [name] 10/24/2021, 2:25 AM

## 2021-10-24 NOTE — Anesthesia Postprocedure Evaluation (Signed)
Anesthesia Post Note  Patient: Janet Jacobs  Procedure(s) Performed: [place]     Patient location during evaluation: PACU Anesthesia Type: Spinal Level of consciousness: awake and alert Pain management: pain level controlled Vital Signs Assessment: post-procedure vital signs reviewed and stable Respiratory status: spontaneous breathing Cardiovascular status: stable Anesthetic complications: no   No notable events documented.  Last Vitals:  Vitals:   10/24/21 0046 10/24/21 0320  BP: (!) 143/92 134/81  Pulse: 84 81  Resp: 19 19  Temp: 36.7 C   SpO2: 100% 99%    Last Pain:  Vitals:   10/24/21 0200  TempSrc:   PainSc: 0-No pain   Pain Goal:                   Nolon Nations

## 2021-10-24 NOTE — Lactation Note (Signed)
This note was copied from a baby's chart. Lactation Consultation Note  Patient Name: Janet Jacobs [id number] Date: 10/24/2021 Reason for consult: Initial assessment Age:41 years  P4, Ex BF.  Baby has been breastfeeding frequently. Mother denies questions or concerns. Feed on demand with cues.  Goal 8-12+ times per day after first 24 hrs.   Lactation brochure givein.    Feeding Mother's Current Feeding Choice: Breast Milk  Interventions Interventions: Print production planner  Consult Status Consult Status: Follow-up Date: 10/25/21 Follow-up type: In-patient    Vivianne Master [facility] 10/24/2021, 8:24 AM

## 2021-10-25 ENCOUNTER — Encounter (HOSPITAL_COMMUNITY): Payer: Self-pay | Admitting: Obstetrics & Gynecology

## 2021-10-25 NOTE — Lactation Note (Signed)
This note was copied from a baby's chart. Lactation Consultation Note  Patient Name: Janet Jacobs Date: 10/25/2021 Reason for consult: Follow-up assessment Age:42 hours, P4, term female infant with -8% weight loss, C/S delivery. Per mom, infant is latching well at breast most feedings today are 30 to 45 minutes in length. Mom was breastfeeding infant on her right breast using the cradle hold position, infant was latched with depth still BF when LC left the room. Mom set up with DEBP, fitted with 27 mm breast flange, LC explained how to use and mom was pumping her left breast while infant was latched on mom's right breast when LC left the room. Mom will pump every 3 hours for 15 minutes and give infant back any her EBM that is pumped after latching infant at the breast. Mom is aware that donor breast milk is available for supplementation to help stabilize infant's weight.  Mom will continue to breastfeed infant according to cues, 8 to 12+ times within 24 hours, and give infant back any EBM after latching infant at the breast. Mom knows to call RN/LC if she has any breastfeeding questions or concerns.   Maternal Data    Feeding Mother's Current Feeding Choice: Breast Milk  LATCH Score Latch: Grasps breast easily, tongue down, lips flanged, rhythmical sucking.  Audible Swallowing: A few with stimulation  Type of Nipple: Everted at rest and after stimulation  Comfort (Breast/Nipple): Soft / non-tender  Hold (Positioning): No assistance needed to correctly position infant at breast.  LATCH Score: 9   Lactation Tools Discussed/Used Tools: Pump Breast pump type: Double-Electric Breast Pump Pump Education: Setup, frequency, and cleaning;Milk Storage Reason for Pumping: Mom will give infant back any EBM, LGA infant with -8% weight loss Pumping frequency: Mom plans to pump every 3 hours for 15 minutes on inital setting.  Interventions Interventions: Position  options;DEBP;Education  Discharge    Consult Status Consult Status: Follow-up Date: 10/26/21 Follow-up type: In-patient    [name] 10/25/2021, 9:12 PM

## 2021-10-25 NOTE — Progress Notes (Signed)
Subjective: Postpartum Day 2: Cesarean Delivery Patient reports incisional pain, tolerating PO, and + flatus.    Objective: Vital signs in last 24 hours: Temp:  [97.9 F (36.6 C)-98.1 F (36.7 C)] 97.9 F (36.6 C) (07/22 0524) Pulse Rate:  [76-86] 82 (07/22 0524) Resp:  [17-20] 17 (07/22 0524) BP: (106-130)/(72-83) 106/72 (07/22 0524) SpO2:  [99 %-100 %] 100 % (07/22 0524)  Physical Exam:  General: alert, cooperative, and no distress Lochia: appropriate Uterine Fundus: firm Incision: no significant drainage DVT Evaluation: No evidence of DVT seen on physical exam.  Recent Labs    10/23/21 1615 10/24/21 0241  HGB 12.1 11.1*  HCT 36.9 33.4*    Assessment/Plan: Status post Cesarean section. Doing well postoperatively.  Continue current care Declines discharge today Plan discharge tomorrow.  Hansel Feinstein, CNM 10/25/2021, 7:25 AM

## 2021-10-25 NOTE — Lactation Note (Signed)
This note was copied from a baby's chart. Lactation Consultation Note  Patient Name: Janet Jacobs [id number] Date: 10/25/2021 Reason for consult: Follow-up assessment;Infant weight loss;Other (Comment);Term (8 % weight loss. per mom baby recently fed. LC reviewed the doc flow sheets / voids/ stools correlate w/ the Wt loss. Per mom family member is bringing her DEBP/she will start post pumping. Per mom the baby has been feeding well.) Age:42 hours Mom aware to call with feeding cues for latch assessment.  Maternal Data    Feeding Mother's Current Feeding Choice: Breast Milk  LATCH Score                    Lactation Tools Discussed/Used    Interventions    Discharge Pump: DEBP;Personal  Consult Status Consult Status: Follow-up Date: 10/25/21 Follow-up type: In-patient    [place] 10/25/2021, 1:01 PM

## 2021-10-26 MED ORDER — OXYCODONE-ACETAMINOPHEN 5-325 MG PO TABS: 1.0000 | ORAL_TABLET | ORAL | 0 refills | Status: AC | PRN

## 2021-10-26 MED ORDER — IBUPROFEN 600 MG PO TABS: 600.0000 mg | ORAL_TABLET | Freq: Four times a day (QID) | ORAL | 0 refills | Status: DC

## 2021-10-26 MED ORDER — SIMETHICONE 80 MG PO CHEW: 80.0000 mg | CHEWABLE_TABLET | ORAL | 0 refills | Status: DC | PRN

## 2021-10-26 NOTE — Lactation Note (Signed)
This note was copied from a baby's chart. Lactation Consultation Note  Patient Name: Janet Jacobs Date: 10/26/2021 Reason for consult: Follow-up assessment;Term;Infant weight loss Age:42 hours  LC in to visit with P4 Mom of term baby on day of discharge.  Baby is at a 10.7% weight loss with good output.  Bilirubin level is good.  Mom has been exclusively breastfeeding and is choosing not to supplement.  Pump set up at bedside.  Mom pumped once.  Baby on the breast in cradle hold, Mom states baby was sleepy.  LC noted that baby was latched to base of nipple and sucking in with her cheeks.  Baby came off the breast and nipple not pinched.  Talked to Mom about the importance of a deep latch to the breast.  Baby also dressed and swaddled in blanket.  Talked to Mom about the benefits of baby being STS. Mom didn't remove the clothes or blanket. Baby still cueing and offered to assist with baby being in football hold to better control baby's latch.  Assisted Mom with a deep latch on left breast.  Mom needed guidance to not use the scissor hold and flatten out her tissue.  Swallowing identified.  Mom taught to use breast compression during sucking bursts.  Mom encouraged to pump after breastfeeding.  Mom reports breasts are heavier today.  Mom shown how to piece together a hand pump.  Encouraged feeding baby at least every 3 hrs, sooner if baby is hungry sooner.  Mom to supplement with EBM due to weight loss.  Engorgement prevention and treatment reviewed.  Mom to [facility] tomorrow.  [facility] has an Building services engineer and Mom also aware of OP lactation support available to her.  LATCH Score Latch: Grasps breast easily, tongue down, lips flanged, rhythmical sucking.  Audible Swallowing: Spontaneous and intermittent  Type of Nipple: Everted at rest and after stimulation  Comfort (Breast/Nipple): Soft / non-tender  Hold (Positioning): Assistance needed to correctly position infant at  breast and maintain latch.  LATCH Score: 9   Lactation Tools Discussed/Used Tools: Pump;Flanges Flange Size: 24 Breast pump type: Double-Electric Breast Pump Reason for Pumping: Support milk supply/weight loss Pumping frequency: once  Interventions Interventions: Breast feeding basics reviewed;Assisted with latch;Skin to skin;Breast compression;Support pillows;Position options;Education;Hand pump  Discharge Discharge Education: Engorgement and breast care;Warning signs for feeding baby;Outpatient recommendation Pump: Hands Free;Personal [facility] hand's free pump)  Consult Status Consult Status: Complete Date: 10/26/21 Follow-up type: Call as needed    Broadus John 10/26/2021, 10:55 AM

## 2021-10-26 NOTE — Progress Notes (Signed)
CSW received a verbal consult from [name]'s nurse for WIC information. CSW met with [name] at bedside, introduced role and explained reason for consult. [name] shares she is unsure if a WIC referral was placed but reports she is interested in WIC services. CSW provided a WIC information sheet to [name] and offered to make a referral. [name] provided verbal consent for referral. [name] declined any additional resource needs.   Signed,  [place]a Mcgrory K. Mikaelah Trostle, MSW, LCSWA, LCASA 10/26/2021 12:12 PM  

## 2021-10-28 ENCOUNTER — Telehealth: Payer: Self-pay | Admitting: Certified Nurse Midwife

## 2021-10-28 DIAGNOSIS — I10 Essential (primary) hypertension: Secondary | ICD-10-CM

## 2021-10-28 MED ORDER — FUROSEMIDE 20 MG PO TABS: 20.0000 mg | ORAL_TABLET | Freq: Two times a day (BID) | ORAL | 0 refills | Status: DC

## 2021-10-28 MED ORDER — NIFEDIPINE ER OSMOTIC RELEASE 30 MG PO TB24: 30.0000 mg | ORAL_TABLET | Freq: Two times a day (BID) | ORAL | 2 refills | Status: DC

## 2021-10-28 NOTE — Telephone Encounter (Signed)
Received call from pt's husband, Atiba, concerned about pt's increased BP (twice >160/80s-90s) and swelling. Lucee delivered via [place] on 10/23/21 and was discharged on 10/26/21, of note, sent home on procardia '30mg'$  daily only (no lasix ordered).  Spoke to patient who is not experiencing any neurologic symptoms, denies headache or visual disturbances.She strongly desires to avoid a hospital admission but is concerned that if she does not get the swelling under control she is headed that way. Discussed with patient that I would suggest increasing her procardia to BID for better coverage and adding a short course of Lasix. Pt agreed to plan of care. Called Dr. Ilda Basset (2nd attending at [facility] today) who agreed with my plan of care and suggested touching base with Ilaisaane in the morning to assess if she needs a closer BP check with [facility] labs in the office tomorrow. Pt agreed to this as well.  Increased procardia dose and lasix sent to pharmacy. Will check in with patient tomorrow and get her added for a RN visit if needed.  Gaylan Gerold, CNM, MSN, [place] Certified Nurse Midwife, [facility]

## 2021-10-29 ENCOUNTER — Other Ambulatory Visit: Payer: Self-pay | Admitting: Certified Nurse Midwife

## 2021-10-29 DIAGNOSIS — Z98891 History of uterine scar from previous surgery: Secondary | ICD-10-CM

## 2021-10-29 MED ORDER — OXYCODONE HCL 5 MG PO TABS: 5.0000 mg | ORAL_TABLET | ORAL | 0 refills | Status: DC | PRN

## 2021-10-29 NOTE — Progress Notes (Signed)
Refilling pt's pain meds, discharged on percocet but still having incisional pain. Changed dose to roxicodone so patient can decrease dose and increase amount of Tylenol for better pain control.  Gaylan Gerold, CNM, MSN, [place] Certified Nurse Midwife, [facility]

## 2021-10-30 ENCOUNTER — Ambulatory Visit: Payer: BC Managed Care – PPO

## 2021-10-31 ENCOUNTER — Telehealth (HOSPITAL_COMMUNITY): Payer: Self-pay | Admitting: *Deleted

## 2021-10-31 NOTE — Telephone Encounter (Signed)
Mom reports feeling good. Incision healing well per mom. No concerns regarding herself at this time. EPDS=2 (hospital score=4) Mom reports baby is well. Feeding, peeing, and pooping without difficulty. Reviewed safe sleep. Mom has no concerns about baby at present.  Odis Hollingshead, RN 10-31-2021 at 11:16am

## 2021-11-03 ENCOUNTER — Encounter: Payer: Self-pay | Admitting: General Practice

## 2021-11-06 ENCOUNTER — Ambulatory Visit (INDEPENDENT_AMBULATORY_CARE_PROVIDER_SITE_OTHER): Payer: BC Managed Care – PPO | Admitting: Certified Nurse Midwife

## 2021-11-06 ENCOUNTER — Other Ambulatory Visit: Payer: Self-pay

## 2021-11-06 ENCOUNTER — Encounter (HOSPITAL_COMMUNITY): Payer: BC Managed Care – PPO

## 2021-11-06 ENCOUNTER — Ambulatory Visit (HOSPITAL_COMMUNITY)
Admission: RE | Admit: 2021-11-06 | Discharge: 2021-11-06 | Disposition: A | Discharge status: Home / Self Care | Payer: BC Managed Care – PPO | Source: Ambulatory Visit | Attending: Certified Nurse Midwife | Admitting: Certified Nurse Midwife

## 2021-11-06 VITALS — BP 141/96 | HR 85 | Ht 64.0 in | Wt 293.0 lb

## 2021-11-06 DIAGNOSIS — M7989 Other specified soft tissue disorders: Secondary | ICD-10-CM | POA: Diagnosis not present

## 2021-11-06 DIAGNOSIS — Z630 Problems in relationship with spouse or partner: Secondary | ICD-10-CM | POA: Diagnosis not present

## 2021-11-06 DIAGNOSIS — Z5189 Encounter for other specified aftercare: Secondary | ICD-10-CM

## 2021-11-06 DIAGNOSIS — F4322 Adjustment disorder with anxiety: Secondary | ICD-10-CM | POA: Diagnosis not present

## 2021-11-06 DIAGNOSIS — I10 Essential (primary) hypertension: Secondary | ICD-10-CM

## 2021-11-06 MED ORDER — NIFEDIPINE ER OSMOTIC RELEASE 60 MG PO TB24: 60.0000 mg | ORAL_TABLET | Freq: Every day | ORAL | 0 refills | Status: DC

## 2021-11-06 MED ORDER — FUROSEMIDE 20 MG PO TABS: 20.0000 mg | ORAL_TABLET | Freq: Two times a day (BID) | ORAL | 0 refills | Status: DC

## 2021-11-06 NOTE — Progress Notes (Signed)
Patient presents to office today for wound check & blood pressure check following up from repeat c-section on 7/20. Her history is significant for [facility]. Patient reports taking nifedipine BID as prescribed. She has had a couple headaches recently but they improve with medication/rest. Denies dizziness or blurry vision. +3 to +4 edema noticed in feet/ankles, more in right foot. She reports wheezing for the past 2 days and feels like something is in her chest but is not congested and denies sore throat. Wheezing heard in right upper lobe upon assessment. C-section incision is clean, dry & intact. Some irritation noted on left side of incision likely due to adhesive. Wound care and signs & symptoms of infection reviewed with patient. Discussed patient assessment/history with Gaylan Gerold, CNM & care turned over to her.  Koren Bound RN BSN 11/06/21

## 2021-11-07 NOTE — Progress Notes (Signed)
Called to room by RN. Pt reported all swelling much improved during lasix dosing without decrease in breastmilk supply. Swelling began to return once off lasix, now having some very mild wheezing with exertion or laying flat. Wheezing eases well without intervention once she sits down/upright. No wheezing noted on auscultation. No s/sx of DVT except for unilateral swelling of her lower right extremity. BP also creeping up as she gets more remote from lasix dosing.  Discussed presentation and findings with Dr. Kennon Rounds who agreed pt should increase her procardia dose, go on another round of lasix and have a Doppler study to r/o DVT in right leg. Also demonstrated lymphatic massage and instructed her to wear a compression sock and elevate her leg as often as possible throughout the day. Pt amenable to this plan.  Doppler order placed, RN called to get appt scheduled for 1:30pm today. Pt to follow up as needed prior to postpartum appt.  Gaylan Gerold, CNM, MSN, [place] Certified Nurse Midwife, [facility]

## 2021-11-20 DIAGNOSIS — F4322 Adjustment disorder with anxiety: Secondary | ICD-10-CM | POA: Diagnosis not present

## 2021-11-20 DIAGNOSIS — Z630 Problems in relationship with spouse or partner: Secondary | ICD-10-CM | POA: Diagnosis not present

## 2021-11-25 NOTE — Progress Notes (Signed)
Postpartum Visit Note  Janet Jacobs is a 41 y.o. [id number] female who presents for a postpartum visit. She is 4 weeks 5 days postpartum following a repeat cesarean section.  I have fully reviewed the prenatal and intrapartum course. The delivery was at 40w 6d gestational weeks.  Anesthesia: spinal. Postpartum course has been complicated by increased BP requiring increased BP meds and two rounds of lasix. Doing well now. Baby is doing well. Baby is feeding by breast and gaining well. Bleeding is intermittent spotting. Bowel function is normal. Bladder function is abnormal: some discomfort with bowel movement and hemorrhoids . Patient is not sexually active. Contraception method is vasectomy. Postpartum depression screening: negative.   Upstream - 11/28/21 1937       Pregnancy Intention Screening   Does the patient want to become pregnant in the next year? No    Does the patient's partner want to become pregnant in the next year? No    Would the patient like to discuss contraceptive options today? N/A   husband had vasectomy one month ago, will practice abstinence or use condoms until he is cleared     Contraception Wrap Up   Contraception Counseling Provided No            The pregnancy intention screening data noted above was reviewed. Potential methods of contraception were discussed. The patient elected to proceed with No data recorded.   Edinburgh Postnatal Depression Scale - 11/26/21 1503       Edinburgh Postnatal Depression Scale:  In the Past 7 Days   I have been able to laugh and see the funny side of things. 0    I have looked forward with enjoyment to things. 0    I have blamed myself unnecessarily when things went wrong. 0    I have been anxious or worried for no good reason. 1    I have felt scared or panicky for no good reason. 0    Things have been getting on top of me. 2    I have been so unhappy that I have had difficulty sleeping. 0    I have felt sad or  miserable. 0    I have been so unhappy that I have been crying. 0    The thought of harming myself has occurred to me. 0    Edinburgh Postnatal Depression Scale Total 3             Health Maintenance Due  Topic Date Due   URINE MICROALBUMIN  Never done   COVID-19 Vaccine (4 - Moderna series) 06/14/2020   INFLUENZA VACCINE  11/04/2021    The following portions of the patient's history were reviewed and updated as appropriate: allergies, current medications, past family history, past medical history, past social history, past surgical history, and problem list.  Review of Systems Pertinent items noted in HPI and remainder of comprehensive ROS otherwise negative.  Objective:  BP (!) 144/91   Wt 283 lb 9.6 oz (128.6 kg)   Breastfeeding Yes   BMI 48.68 kg/m    Constitutional: Alert, oriented female in no physical distress.  HEENT: PERRLA Skin: normal color and turgor, no rash Cardiovascular: normal rate & rhythm Respiratory: normal effort, no problems with respiration noted GI: Abd soft, non-tender MS: Extremities nontender, no edema, normal ROM Neurologic: Alert and oriented x 4.  GU: no CVA tenderness Pelvic: exam deferred Breasts: normal lactating breasts, no edema or signs of nipple damage Incision: well-approximated without exudate,  redness or edema.  Assessment:  Postpartum care and examination  Chronic hypertension - Plan: NIFEdipine (PROCARDIA XL) 60 MG 24 hr tablet  History of gestational diabetes   Plan:   Essential components of care per ACOG recommendations:  1.  Mood and well being: Patient with negative depression screening today. Reviewed local resources for support.  - Patient tobacco use? No.   - hx of drug use? No.    2. Infant care and feeding:  -Patient currently breastmilk feeding? Yes. Reviewed importance of draining breast regularly to support lactation.  -Social determinants of health (SDOH) reviewed in EPIC. No concerns  3. Sexuality,  contraception and birth spacing - Patient does not want a pregnancy in the next year.  Desired family size is 4 children.  - Reviewed reproductive life planning. Reviewed contraceptive methods based on pt preferences and effectiveness.  Patient desired Vasectomy today.   - Discussed birth spacing of 18 months  4. Sleep and fatigue -Encouraged family/partner/community support of 4 hrs of uninterrupted sleep to help with mood and fatigue  5. Physical Recovery  - Discussed patients delivery and complications. She describes her labor as mixed. - Patient had a C-section repeat; no problems after delivery. Incision healing well. - Patient has urinary incontinence? No. - Patient is safe to resume physical and sexual activity  6.  Health Maintenance - HM due items addressed No - pt declined pp GTT, will follow up with PCP - Last pap smear  Diagnosis  Date Value Ref Range Status  12/13/2020   Final   - Negative for intraepithelial lesion or malignancy (NILM)   Pap smear not done at today's visit.  -Breast Cancer screening indicated? No.   7. Chronic Disease/Pregnancy Condition follow up: Hypertension and Gestational Diabetes - PCP follow up for BP management, has an annual in one month.  Gabriel Carina, [place] for [employer], [place]

## 2021-11-26 ENCOUNTER — Ambulatory Visit (INDEPENDENT_AMBULATORY_CARE_PROVIDER_SITE_OTHER): Payer: BC Managed Care – PPO | Admitting: Certified Nurse Midwife

## 2021-11-26 ENCOUNTER — Encounter: Payer: Self-pay | Admitting: Certified Nurse Midwife

## 2021-11-26 ENCOUNTER — Other Ambulatory Visit: Payer: Self-pay

## 2021-11-26 DIAGNOSIS — Z8632 Personal history of gestational diabetes: Secondary | ICD-10-CM

## 2021-11-26 DIAGNOSIS — I10 Essential (primary) hypertension: Secondary | ICD-10-CM

## 2021-11-26 MED ORDER — NIFEDIPINE ER OSMOTIC RELEASE 60 MG PO TB24: 60.0000 mg | ORAL_TABLET | Freq: Two times a day (BID) | ORAL | 3 refills | Status: AC

## 2021-12-18 DIAGNOSIS — Z0000 Encounter for general adult medical examination without abnormal findings: Secondary | ICD-10-CM | POA: Diagnosis not present

## 2021-12-18 DIAGNOSIS — Z13220 Encounter for screening for lipoid disorders: Secondary | ICD-10-CM | POA: Diagnosis not present

## 2021-12-18 DIAGNOSIS — H6122 Impacted cerumen, left ear: Secondary | ICD-10-CM | POA: Diagnosis not present

## 2021-12-18 DIAGNOSIS — I10 Essential (primary) hypertension: Secondary | ICD-10-CM | POA: Diagnosis not present

## 2021-12-18 DIAGNOSIS — N3001 Acute cystitis with hematuria: Secondary | ICD-10-CM | POA: Diagnosis not present

## 2022-01-23 ENCOUNTER — Encounter: Payer: Self-pay | Admitting: Certified Nurse Midwife

## 2022-01-26 ENCOUNTER — Encounter: Payer: Self-pay | Admitting: General Practice

## 2022-02-12 DIAGNOSIS — Z630 Problems in relationship with spouse or partner: Secondary | ICD-10-CM | POA: Diagnosis not present

## 2022-02-12 DIAGNOSIS — F4322 Adjustment disorder with anxiety: Secondary | ICD-10-CM | POA: Diagnosis not present

## 2022-03-06 DIAGNOSIS — Z419 Encounter for procedure for purposes other than remedying health state, unspecified: Secondary | ICD-10-CM | POA: Diagnosis not present

## 2022-03-09 DIAGNOSIS — L918 Other hypertrophic disorders of the skin: Secondary | ICD-10-CM | POA: Diagnosis not present

## 2022-03-09 DIAGNOSIS — I10 Essential (primary) hypertension: Secondary | ICD-10-CM | POA: Diagnosis not present

## 2022-04-06 DIAGNOSIS — Z419 Encounter for procedure for purposes other than remedying health state, unspecified: Secondary | ICD-10-CM | POA: Diagnosis not present

## 2022-04-10 ENCOUNTER — Encounter: Payer: Self-pay | Admitting: Certified Nurse Midwife

## 2022-04-16 DIAGNOSIS — F4322 Adjustment disorder with anxiety: Secondary | ICD-10-CM | POA: Diagnosis not present

## 2022-04-16 DIAGNOSIS — Z630 Problems in relationship with spouse or partner: Secondary | ICD-10-CM | POA: Diagnosis not present

## 2022-05-07 DIAGNOSIS — Z419 Encounter for procedure for purposes other than remedying health state, unspecified: Secondary | ICD-10-CM | POA: Diagnosis not present

## 2022-05-14 DIAGNOSIS — F4322 Adjustment disorder with anxiety: Secondary | ICD-10-CM | POA: Diagnosis not present

## 2022-05-14 DIAGNOSIS — Z630 Problems in relationship with spouse or partner: Secondary | ICD-10-CM | POA: Diagnosis not present

## 2022-05-21 DIAGNOSIS — F4322 Adjustment disorder with anxiety: Secondary | ICD-10-CM | POA: Diagnosis not present

## 2022-05-21 DIAGNOSIS — Z630 Problems in relationship with spouse or partner: Secondary | ICD-10-CM | POA: Diagnosis not present

## 2022-05-29 IMAGING — US US MFM OB FOLLOW-UP
1 series · 13 of 28 positions shown · non-contrast
Comparison: none

[Series 1: us mfm ob follow-up · 13 of 70 slices shown]
[im 3/70]
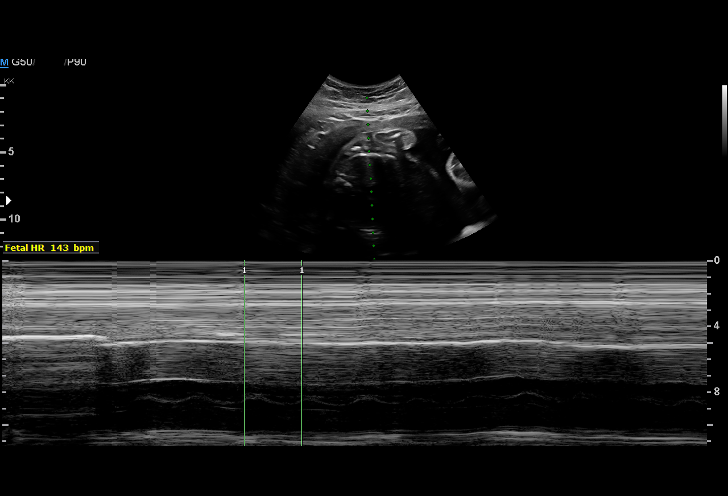
[im 8/70]
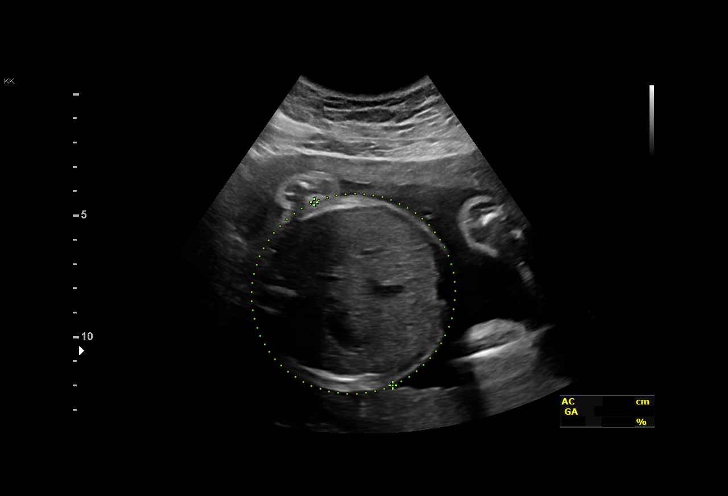
[im 13/70]
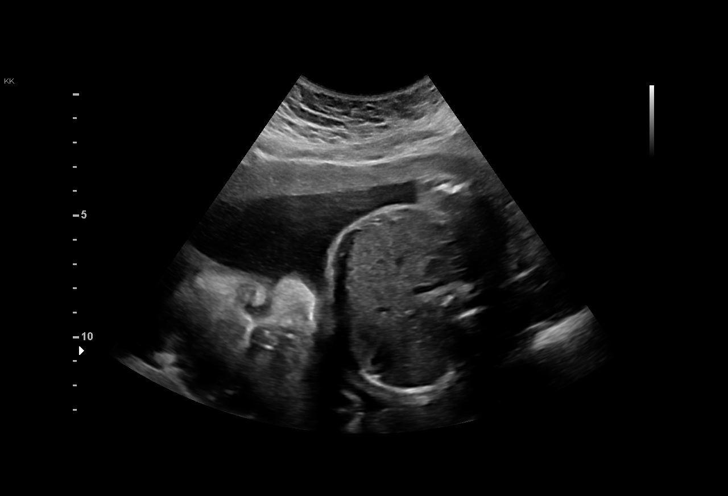
[im 18/70]
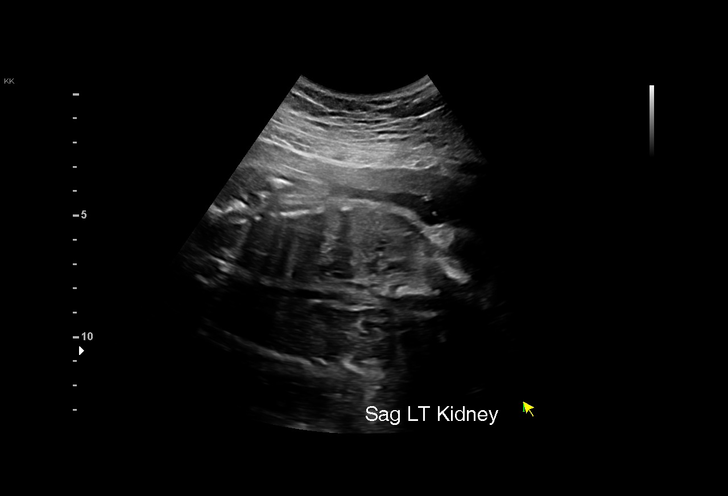
[im 24/70]
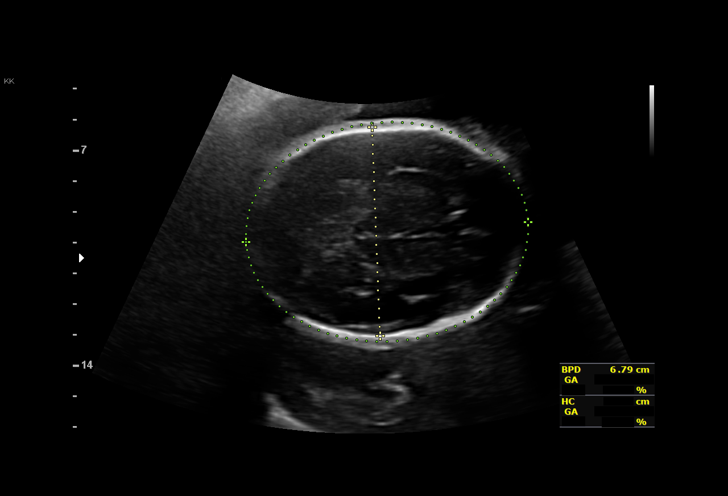
[im 29/70]
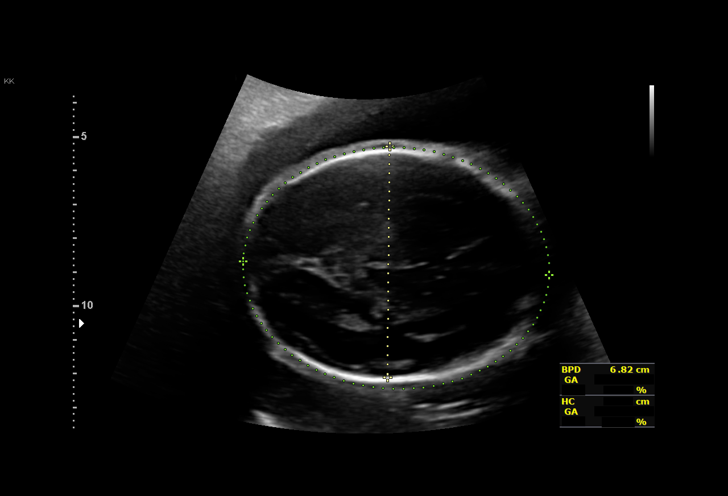
[im 36/70]
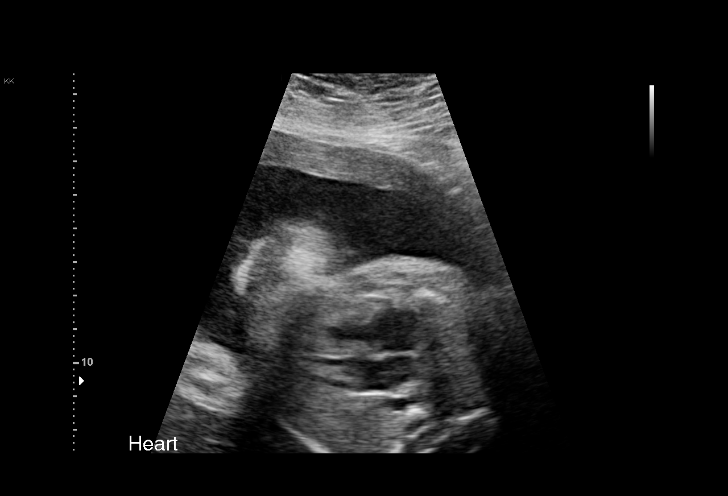
[im 41/70]
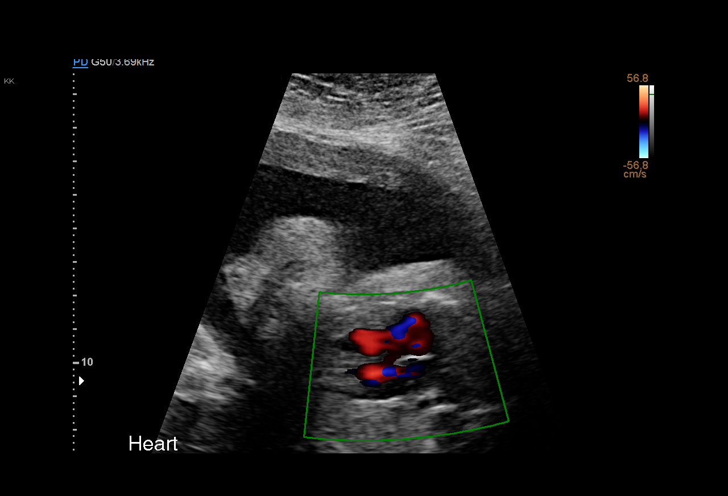
[im 47/70]
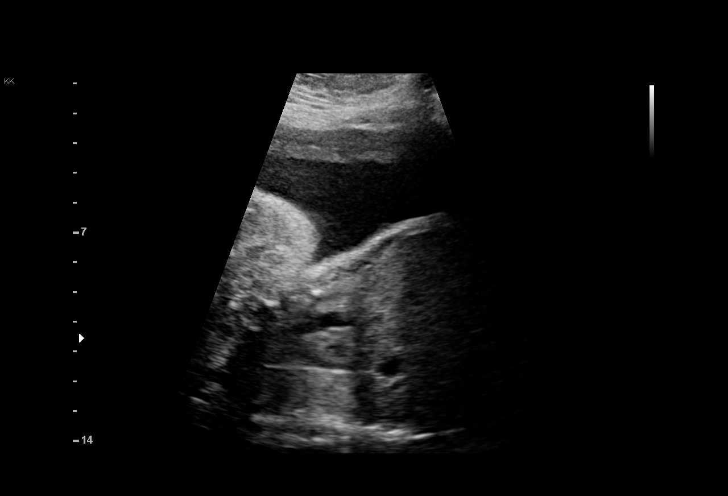
[im 52/70]
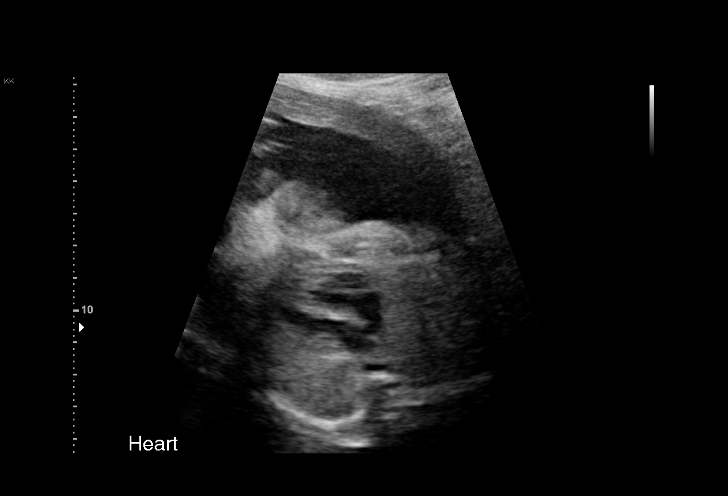
[im 57/70]
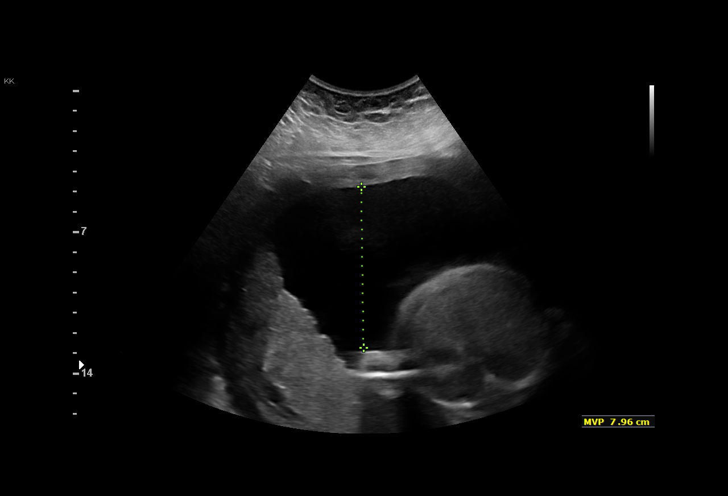
[im 62/70]
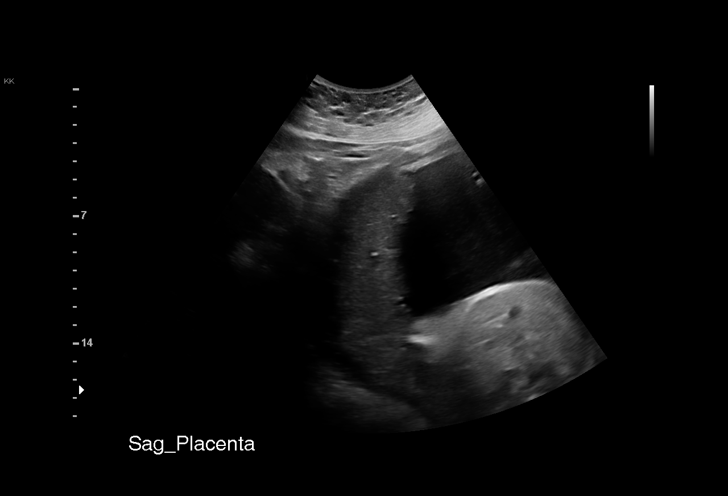
[im 67/70]
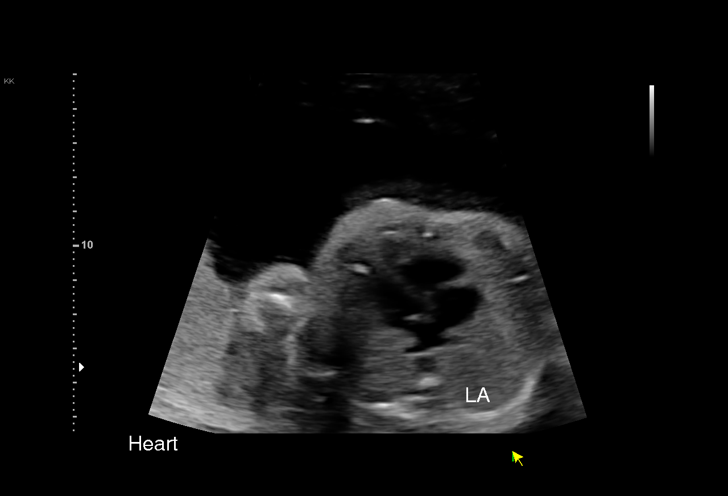

[13 of 28 positions shown; findings below may reference images not displayed]

RAUF

                   JIM

Indications

 27 weeks gestation of pregnancy
 Hypertension - Chronic/Pre-existing
 (procardia)
 Advanced maternal age multigravida 35+,
 second trimester (40 y.o)
 Abnormal biochemical screen (high risk due
 to fetal DNA fraction 2.5%)
 Obesity complicating pregnancy, second
 trimester (BMI 52)
 History of cesarean delivery, currently
 pregnant
 Negative Horizon
 Encounter for other antenatal screening
 follow-up
Fetal Evaluation

 Num Of Fetuses:         1
 Fetal Heart Rate(bpm):  143
 Cardiac Activity:       Observed
 Presentation:           Breech
 Placenta:               Posterior
 P. Cord Insertion:      Previously Visualized

 Amniotic Fluid
 AFI FV:      Within normal limits

                             Largest Pocket(cm)
                             8.
Biometry

 BPD:      68.2  mm     G. Age:  27w 3d         29  %    CI:        71.47   %    70 - 86
                                                         FL/HC:      20.4   %    18.8 -
 HC:      256.9  mm     G. Age:  27w 6d         26  %    HC/AC:      0.99        1.05 -
 AC:      258.6  mm     G. Age:  30w 0d         95  %    FL/BPD:     77.0   %    71 - 87
 FL:       52.5  mm     G. Age:  28w 0d         42  %    FL/AC:      20.3   %    20 - 24

 Est. FW:    7277  gm    2 lb 14 oz      84  %
OB History

 Blood Type:   A+
 Gravidity:    6         Term:   3         SAB:   2
 Living:       3
Gestational Age

 LMP:           27w 5d        Date:  01/10/21                 EDD:   10/17/21
 U/S Today:     28w 2d                                        EDD:   10/13/21
 Best:          27w 5d     Det. By:  LMP  (01/10/21)          EDD:   10/17/21
Anatomy

 Cranium:               Appears normal         Aortic Arch:            Appears normal
 Cavum:                 Appears normal         Ductal Arch:            Appears normal
 Ventricles:            Appears normal         Diaphragm:              Appears normal
 Choroid Plexus:        Previously seen        Stomach:                Appears normal, left
                                                                       sided
 Cerebellum:            Appears normal         Abdomen:                Appears normal
 Posterior Fossa:       Previously seen        Abdominal Wall:         Previously seen
 Nuchal Fold:           Not applicable (>20    Cord Vessels:           Previously seen
                        wks GA)
 Face:                  Orbits and profile     Kidneys:                Appear normal
                        previously seen
 Lips:                  Previously seen        Bladder:                Appears normal
 Thoracic:              Appears normal         Spine:                  Previously seen
 Heart:                 Appears normal         Upper Extremities:      Previously seen
                        (4CH, axis, and
                        situs)
 RVOT:                  Previously seen        Lower Extremities:      Previously seen
 LVOT:                  Appears normal

 Other:  Fetus appears to be female. VC, 3VV, Nasal bone, Falx, Hands and
         feet prev visualized. Technically difficult due to maternal habitus and
         fetal position.
Cervix Uterus Adnexa

 Cervix
 Not visualized (advanced GA >44wks)
Impression

 Follow up growth due to elevated BMI, chronic hypertension
 on procardia and AMA
 Normal interval growth with measurements consistent with
 dates
 Good fetal movement and amniotic fluid volume

 Ms. Nose Kuri had low fetal fraction NIPT and declined
 repeat test and an amniocentesis.
 I conveyed that no markers of aneuploidy are seen today.
Recommendations

 Follow up growth in 4 weeks.
 Initiate weekly testing at 32 weeks
 These exams were ordered today.

## 2022-06-05 DIAGNOSIS — Z419 Encounter for procedure for purposes other than remedying health state, unspecified: Secondary | ICD-10-CM | POA: Diagnosis not present

## 2022-06-11 DIAGNOSIS — F4322 Adjustment disorder with anxiety: Secondary | ICD-10-CM | POA: Diagnosis not present

## 2022-06-11 DIAGNOSIS — Z630 Problems in relationship with spouse or partner: Secondary | ICD-10-CM | POA: Diagnosis not present

## 2022-06-12 ENCOUNTER — Telehealth: Payer: Self-pay

## 2022-06-12 NOTE — Telephone Encounter (Signed)
Mychart msg sent

## 2022-07-02 DIAGNOSIS — Z630 Problems in relationship with spouse or partner: Secondary | ICD-10-CM | POA: Diagnosis not present

## 2022-07-02 DIAGNOSIS — F4322 Adjustment disorder with anxiety: Secondary | ICD-10-CM | POA: Diagnosis not present

## 2022-07-06 DIAGNOSIS — Z419 Encounter for procedure for purposes other than remedying health state, unspecified: Secondary | ICD-10-CM | POA: Diagnosis not present

## 2022-07-24 IMAGING — US US MFM FETAL BPP W/O NON-STRESS
1 series · 13 of 26 positions shown · non-contrast
Comparison: none

[Series 1: us mfm fetal bpp w/o non-stress · 26 acquisitions, 13 frames shown]
[im 2/26]
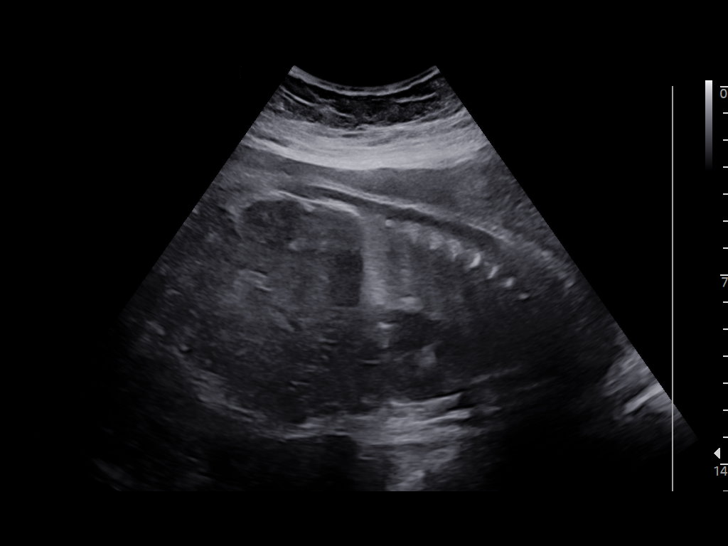
[im 4/26]
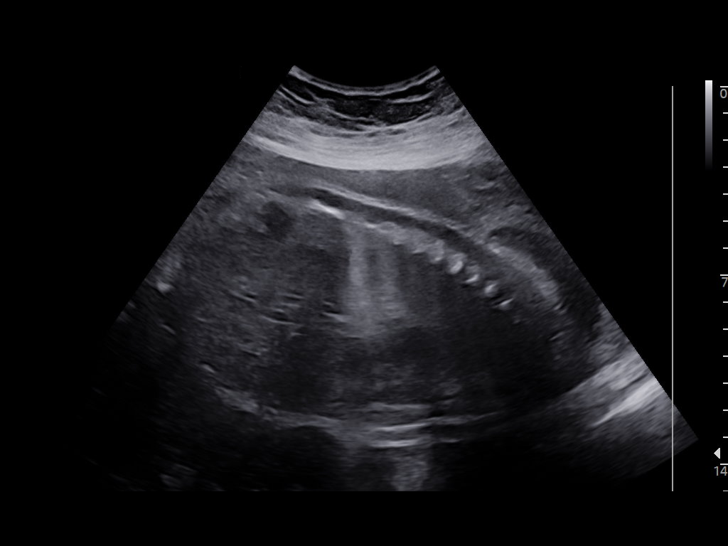
[im 6/26]
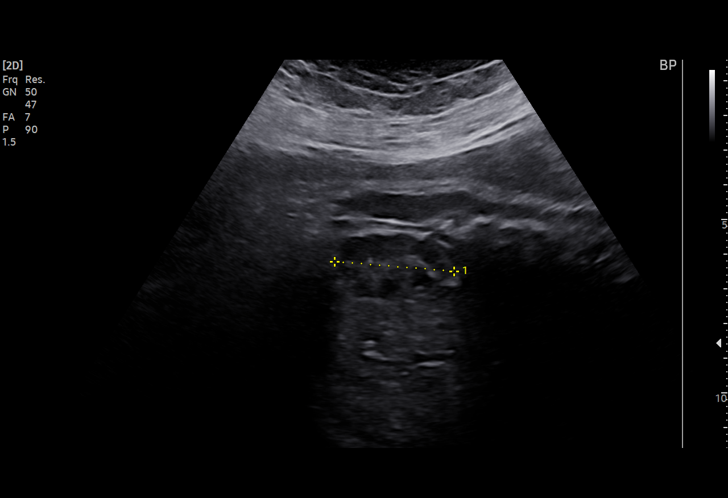
[im 8/26]
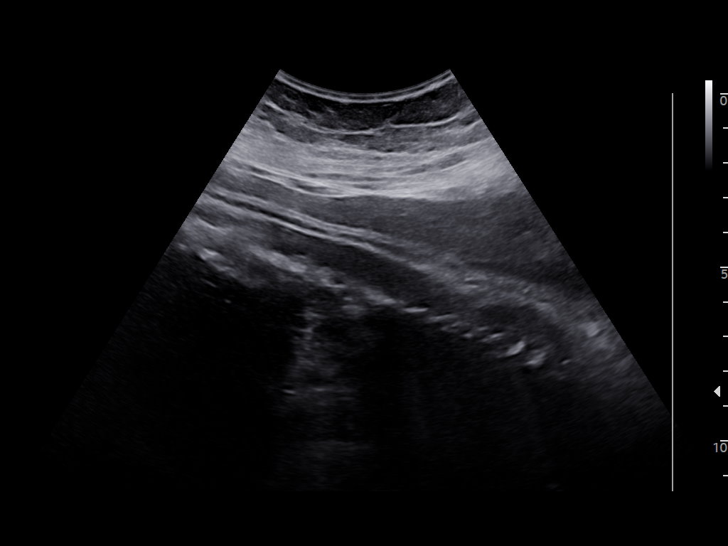
[im 10/26]
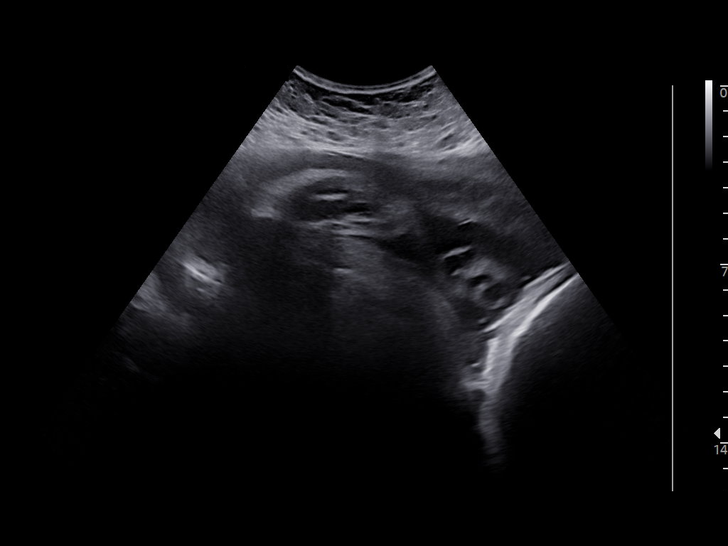
[im 12/26]
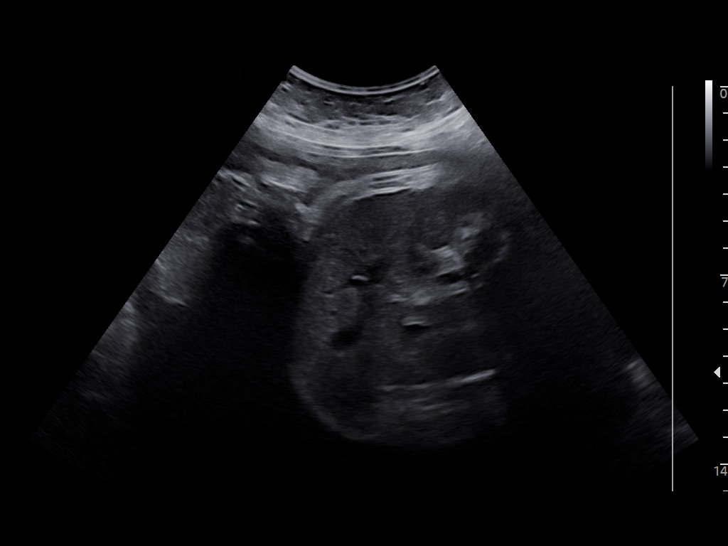
[im 14/26]
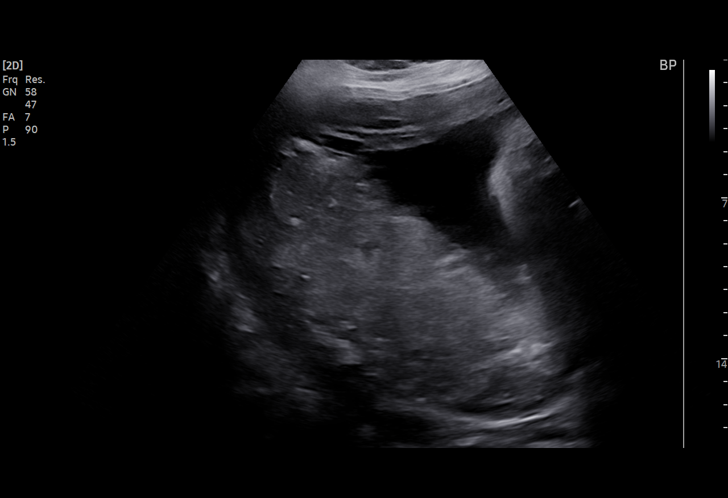
[im 16/26]
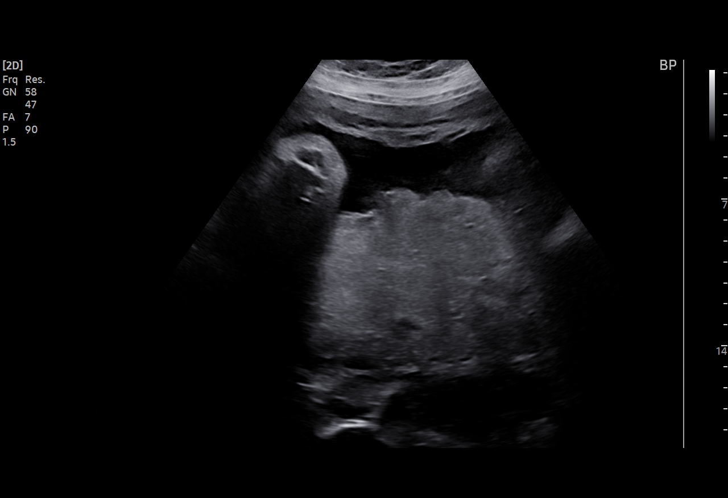
[im 18/26]
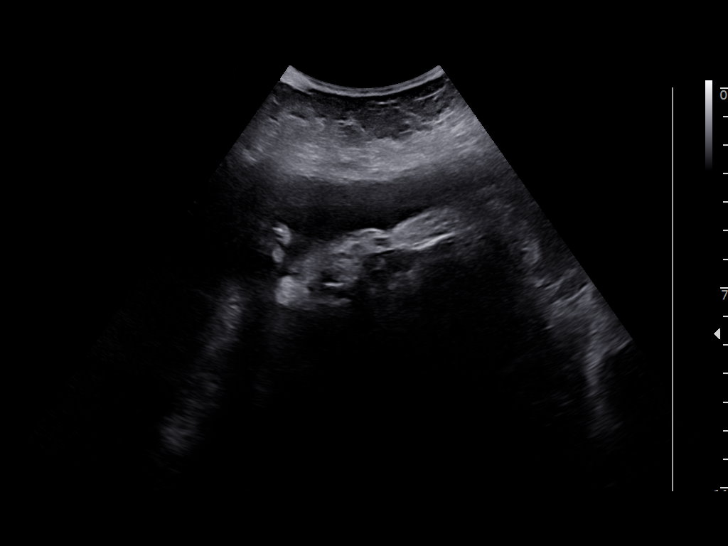
[im 20/26]
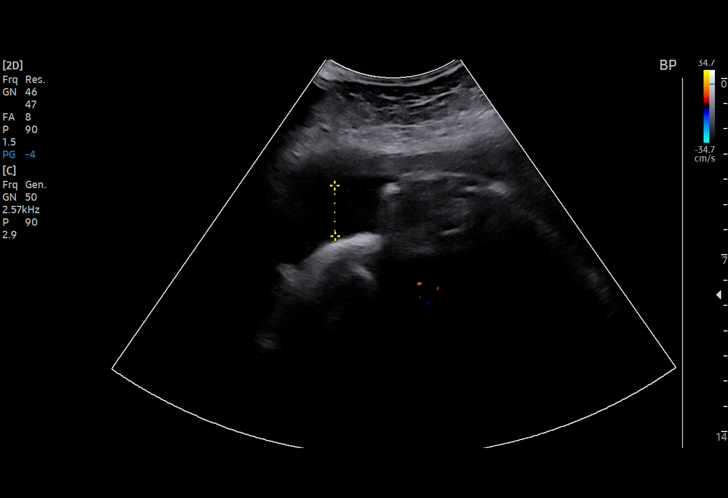
[im 22/26]
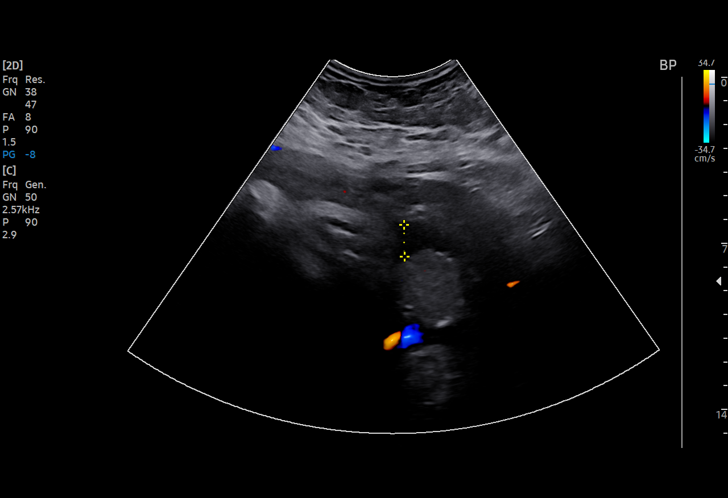
[im 24/26]
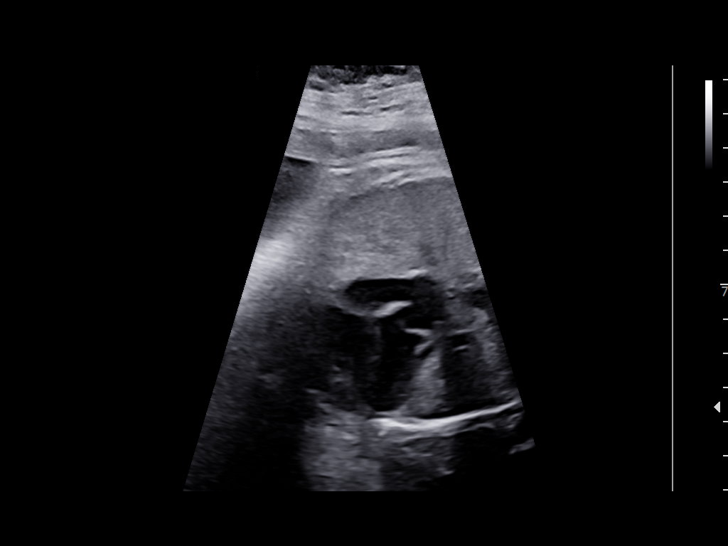
[im 26/26]
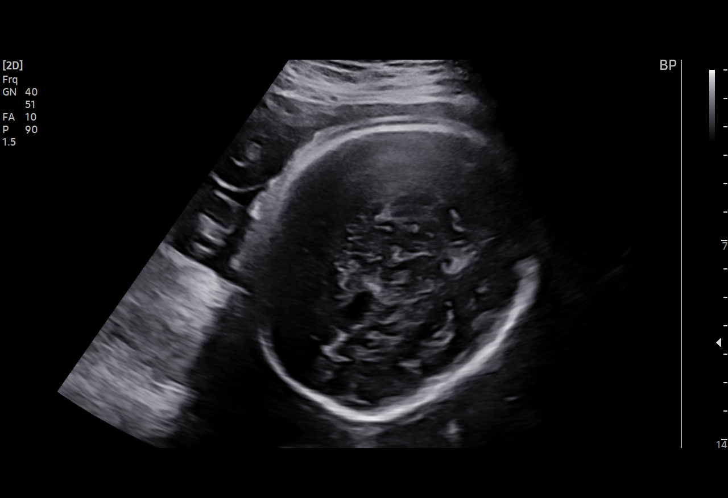

[13 of 26 positions shown; findings below may reference images not displayed]

AS LEGACY

                   SA

Indications

 Hypertension - Chronic/Pre-existing
 (procardia)
 Obesity complicating pregnancy, third
 trimester
 Advanced maternal age multigravida 35+,
 third trimester
 Abnormal biochemical screen (high risk due
 to fetal DNA fraction 2.5%)
 Gestational diabetes in pregnancy,
 controlled by oral hypoglycemic drugs
 History of cesarean delivery, currently
 pregnant
 Negative Horizon
 35 weeks gestation of pregnancy
Vital Signs

 BP:          143/84
Fetal Evaluation

 Num Of Fetuses:         1
 Fetal Heart Rate(bpm):  159
 Cardiac Activity:       Observed
 Presentation:           Cephalic
 Placenta:               Posterior
 P. Cord Insertion:      Previously Visualized
 Amniotic Fluid
 AFI FV:      Within normal limits

 AFI Sum(cm)     %Tile       Largest Pocket(cm)
 9.45            17

 RUQ(cm)       RLQ(cm)       LUQ(cm)        LLQ(cm)
 3.22          1.34          2
Biophysical Evaluation

 Amniotic F.V:   Pocket => 2 cm             F. Tone:        Observed
 F. Movement:    Observed                   Score:          [DATE]
 F. Breathing:   Observed
OB History

 Blood Type:   A+
 Gravidity:    6         Term:   3         SAB:   2
 Living:       3
Gestational Age

 LMP:           35w 5d        Date:  01/10/21                  EDD:   10/17/21
 Best:          35w 5d     Det. By:  LMP  (01/10/21)          EDD:   10/17/21
Anatomy

 Cranium:               Appears normal         Aortic Arch:            Previously seen
 Cavum:                 Appears normal         Ductal Arch:            Previously seen
 Ventricles:            Previously seen        Diaphragm:              Appears normal
 Choroid Plexus:        Previously seen        Stomach:                Appears normal, left
                                                                       sided
 Cerebellum:            Previously seen        Abdomen:                Appears normal
 Posterior Fossa:       Previously seen        Abdominal Wall:         Previously seen
 Nuchal Fold:           Not applicable (>20    Cord Vessels:           Previously seen
                        wks GA)
 Face:                  Orbits and profile     Kidneys:                Appear normal
                        previously seen
 Lips:                  Previously seen        Bladder:                Appears normal
 Thoracic:              Previously seen        Spine:                  Previously seen
 Heart:                 Previously seen        Upper Extremities:      Previously seen
 RVOT:                  Previously seen        Lower Extremities:      Previously seen
 LVOT:                  Previously seen

 Other:  Female gender previously seen. VC, 3VV, Nasal bone, Falx, Hands
         and feet prev visualized. Technically difficult due to maternal habitus
         and fetal position.
Cervix Uterus Adnexa

 Cervix
 Not visualized (advanced GA >20wks)

 Uterus
 Normal shape and size.
 Right Ovary
 Within normal limits.

 Left Ovary
 Within normal limits.
Comments

 This patient was seen for a BPP due to maternal obesity with
 a BMI of 52,  chronic hypertension treated with Procardia,
 and gestational diabetes treated with metformin.  She denies
 any problems since her last exam.
 A biophysical profile performed today was [DATE].
 There was normal amniotic fluid noted on today's ultrasound
 exam.
 She will return in 1 week for another BPP and growth scan.

## 2022-08-05 DIAGNOSIS — Z419 Encounter for procedure for purposes other than remedying health state, unspecified: Secondary | ICD-10-CM | POA: Diagnosis not present

## 2022-09-05 DIAGNOSIS — Z419 Encounter for procedure for purposes other than remedying health state, unspecified: Secondary | ICD-10-CM | POA: Diagnosis not present

## 2022-10-05 DIAGNOSIS — Z419 Encounter for procedure for purposes other than remedying health state, unspecified: Secondary | ICD-10-CM | POA: Diagnosis not present

## 2022-10-23 DIAGNOSIS — F4322 Adjustment disorder with anxiety: Secondary | ICD-10-CM | POA: Diagnosis not present

## 2022-10-23 DIAGNOSIS — Z630 Problems in relationship with spouse or partner: Secondary | ICD-10-CM | POA: Diagnosis not present

## 2022-11-05 DIAGNOSIS — Z419 Encounter for procedure for purposes other than remedying health state, unspecified: Secondary | ICD-10-CM | POA: Diagnosis not present

## 2022-11-16 DIAGNOSIS — I10 Essential (primary) hypertension: Secondary | ICD-10-CM | POA: Diagnosis not present

## 2022-12-06 DIAGNOSIS — Z419 Encounter for procedure for purposes other than remedying health state, unspecified: Secondary | ICD-10-CM | POA: Diagnosis not present

## 2022-12-24 DIAGNOSIS — I10 Essential (primary) hypertension: Secondary | ICD-10-CM | POA: Diagnosis not present

## 2023-01-05 DIAGNOSIS — Z419 Encounter for procedure for purposes other than remedying health state, unspecified: Secondary | ICD-10-CM | POA: Diagnosis not present

## 2023-01-08 DIAGNOSIS — I10 Essential (primary) hypertension: Secondary | ICD-10-CM | POA: Diagnosis not present

## 2023-01-08 DIAGNOSIS — Z0000 Encounter for general adult medical examination without abnormal findings: Secondary | ICD-10-CM | POA: Diagnosis not present

## 2023-01-08 DIAGNOSIS — Z23 Encounter for immunization: Secondary | ICD-10-CM | POA: Diagnosis not present

## 2023-01-08 DIAGNOSIS — E7800 Pure hypercholesterolemia, unspecified: Secondary | ICD-10-CM | POA: Diagnosis not present

## 2023-01-11 ENCOUNTER — Other Ambulatory Visit: Payer: Self-pay | Admitting: Family Medicine

## 2023-01-11 DIAGNOSIS — Z1231 Encounter for screening mammogram for malignant neoplasm of breast: Secondary | ICD-10-CM

## 2023-02-05 DIAGNOSIS — Z419 Encounter for procedure for purposes other than remedying health state, unspecified: Secondary | ICD-10-CM | POA: Diagnosis not present

## 2023-02-12 ENCOUNTER — Ambulatory Visit
Admission: RE | Admit: 2023-02-12 | Discharge: 2023-02-12 | Disposition: A | Discharge status: Home / Self Care | Payer: BC Managed Care – PPO | Source: Ambulatory Visit | Attending: Family Medicine | Admitting: Family Medicine

## 2023-02-12 DIAGNOSIS — Z1231 Encounter for screening mammogram for malignant neoplasm of breast: Secondary | ICD-10-CM

## 2023-02-16 DIAGNOSIS — R748 Abnormal levels of other serum enzymes: Secondary | ICD-10-CM | POA: Diagnosis not present

## 2023-02-17 ENCOUNTER — Other Ambulatory Visit: Payer: Self-pay | Admitting: Family Medicine

## 2023-02-17 DIAGNOSIS — R928 Other abnormal and inconclusive findings on diagnostic imaging of breast: Secondary | ICD-10-CM

## 2023-03-07 DIAGNOSIS — Z419 Encounter for procedure for purposes other than remedying health state, unspecified: Secondary | ICD-10-CM | POA: Diagnosis not present

## 2023-03-08 ENCOUNTER — Other Ambulatory Visit: Payer: Self-pay | Admitting: Family Medicine

## 2023-03-08 ENCOUNTER — Ambulatory Visit
Admission: RE | Admit: 2023-03-08 | Discharge: 2023-03-08 | Disposition: A | Discharge status: Home / Self Care | Payer: BC Managed Care – PPO | Source: Ambulatory Visit | Attending: Family Medicine | Admitting: Family Medicine

## 2023-03-08 DIAGNOSIS — R928 Other abnormal and inconclusive findings on diagnostic imaging of breast: Secondary | ICD-10-CM | POA: Diagnosis not present

## 2023-03-08 DIAGNOSIS — N6489 Other specified disorders of breast: Secondary | ICD-10-CM

## 2023-04-07 DIAGNOSIS — Z419 Encounter for procedure for purposes other than remedying health state, unspecified: Secondary | ICD-10-CM | POA: Diagnosis not present

## 2023-05-08 DIAGNOSIS — Z419 Encounter for procedure for purposes other than remedying health state, unspecified: Secondary | ICD-10-CM | POA: Diagnosis not present

## 2023-05-17 DIAGNOSIS — M542 Cervicalgia: Secondary | ICD-10-CM | POA: Diagnosis not present

## 2023-06-05 DIAGNOSIS — Z419 Encounter for procedure for purposes other than remedying health state, unspecified: Secondary | ICD-10-CM | POA: Diagnosis not present

## 2023-07-09 DIAGNOSIS — E7800 Pure hypercholesterolemia, unspecified: Secondary | ICD-10-CM | POA: Diagnosis not present

## 2023-07-09 DIAGNOSIS — I10 Essential (primary) hypertension: Secondary | ICD-10-CM | POA: Diagnosis not present

## 2023-07-17 DIAGNOSIS — Z419 Encounter for procedure for purposes other than remedying health state, unspecified: Secondary | ICD-10-CM | POA: Diagnosis not present

## 2023-08-16 DIAGNOSIS — Z419 Encounter for procedure for purposes other than remedying health state, unspecified: Secondary | ICD-10-CM | POA: Diagnosis not present

## 2023-09-06 ENCOUNTER — Ambulatory Visit: Admission: RE | Admit: 2023-09-06 | Payer: BC Managed Care – PPO | Source: Ambulatory Visit

## 2023-09-06 ENCOUNTER — Ambulatory Visit
Admission: RE | Admit: 2023-09-06 | Discharge: 2023-09-06 | Disposition: A | Discharge status: Home / Self Care | Payer: BC Managed Care – PPO | Source: Ambulatory Visit | Attending: Family Medicine | Admitting: Family Medicine

## 2023-09-06 DIAGNOSIS — N6489 Other specified disorders of breast: Secondary | ICD-10-CM

## 2023-09-06 DIAGNOSIS — R928 Other abnormal and inconclusive findings on diagnostic imaging of breast: Secondary | ICD-10-CM

## 2023-09-07 ENCOUNTER — Other Ambulatory Visit: Payer: Self-pay | Admitting: Family Medicine

## 2023-09-07 DIAGNOSIS — N6489 Other specified disorders of breast: Secondary | ICD-10-CM

## 2023-09-16 DIAGNOSIS — Z419 Encounter for procedure for purposes other than remedying health state, unspecified: Secondary | ICD-10-CM | POA: Diagnosis not present

## 2023-10-16 DIAGNOSIS — Z419 Encounter for procedure for purposes other than remedying health state, unspecified: Secondary | ICD-10-CM | POA: Diagnosis not present

## 2023-11-16 DIAGNOSIS — Z419 Encounter for procedure for purposes other than remedying health state, unspecified: Secondary | ICD-10-CM | POA: Diagnosis not present

## 2023-12-17 DIAGNOSIS — Z419 Encounter for procedure for purposes other than remedying health state, unspecified: Secondary | ICD-10-CM | POA: Diagnosis not present

## 2023-12-30 NOTE — Progress Notes (Signed)
 Janet Jacobs                                          MRN: [id number]   12/30/2023   The [facility] Quality Team Specialist reviewed this patient medical record for the purposes of chart review for care gap closure. The following were reviewed: chart review for care gap closure-controlling blood pressure.    [facility] Quality Team

## 2024-02-08 DIAGNOSIS — Z23 Encounter for immunization: Secondary | ICD-10-CM | POA: Diagnosis not present

## 2024-02-08 DIAGNOSIS — F411 Generalized anxiety disorder: Secondary | ICD-10-CM | POA: Diagnosis not present

## 2024-02-08 DIAGNOSIS — I10 Essential (primary) hypertension: Secondary | ICD-10-CM | POA: Diagnosis not present

## 2024-02-08 DIAGNOSIS — Z0000 Encounter for general adult medical examination without abnormal findings: Secondary | ICD-10-CM | POA: Diagnosis not present

## 2024-02-08 DIAGNOSIS — E7800 Pure hypercholesterolemia, unspecified: Secondary | ICD-10-CM | POA: Diagnosis not present

## 2024-02-08 DIAGNOSIS — D509 Iron deficiency anemia, unspecified: Secondary | ICD-10-CM | POA: Diagnosis not present

## 2024-03-08 ENCOUNTER — Ambulatory Visit
Admission: RE | Admit: 2024-03-08 | Discharge: 2024-03-08 | Disposition: A | Source: Ambulatory Visit | Attending: Family Medicine | Admitting: Family Medicine

## 2024-03-08 DIAGNOSIS — N6489 Other specified disorders of breast: Secondary | ICD-10-CM

## 2024-03-08 DIAGNOSIS — R928 Other abnormal and inconclusive findings on diagnostic imaging of breast: Secondary | ICD-10-CM | POA: Diagnosis not present
